# Patient Record
Sex: Male | Born: 1955 | Race: Black or African American | Hispanic: No | Marital: Married | Smoking: Never smoker
Health system: Southern US, Community
[De-identification: ages and names within clinical notes are randomized; demographics above are authoritative.]

## PROBLEM LIST (undated history)

## (undated) DIAGNOSIS — D332 Benign neoplasm of brain, unspecified: Secondary | ICD-10-CM

## (undated) DIAGNOSIS — I10 Essential (primary) hypertension: Secondary | ICD-10-CM

## (undated) DIAGNOSIS — G8929 Other chronic pain: Secondary | ICD-10-CM

## (undated) DIAGNOSIS — I7100 Dissection of unspecified site of aorta: Secondary | ICD-10-CM

## (undated) DIAGNOSIS — M549 Dorsalgia, unspecified: Secondary | ICD-10-CM

## (undated) HISTORY — PX: CARDIAC SURGERY: SHX584

## (undated) HISTORY — PX: BACK SURGERY: SHX140

## (undated) HISTORY — PX: BRAIN SURGERY: SHX531

---

## 2016-12-20 ENCOUNTER — Emergency Department: Payer: Medicare Other

## 2016-12-20 ENCOUNTER — Emergency Department
Admission: EM | Admit: 2016-12-20 | Discharge: 2016-12-20 | Disposition: A | Discharge status: Home / Self Care | Payer: Medicare Other | Attending: Student in an Organized Health Care Education/Training Program | Admitting: Student in an Organized Health Care Education/Training Program

## 2016-12-20 ENCOUNTER — Encounter: Payer: Self-pay | Admitting: Emergency Medicine

## 2016-12-20 DIAGNOSIS — E860 Dehydration: Secondary | ICD-10-CM | POA: Insufficient documentation

## 2016-12-20 DIAGNOSIS — R55 Syncope and collapse: Secondary | ICD-10-CM

## 2016-12-20 DIAGNOSIS — I10 Essential (primary) hypertension: Secondary | ICD-10-CM | POA: Insufficient documentation

## 2016-12-20 HISTORY — DX: Essential (primary) hypertension: I10

## 2016-12-20 HISTORY — DX: Rider (driver) (passenger) of other motorcycle injured in unspecified traffic accident, initial encounter: V29.99XA

## 2016-12-20 HISTORY — DX: Other chronic pain: G89.29

## 2016-12-20 HISTORY — DX: Benign neoplasm of brain, unspecified: D33.2

## 2016-12-20 HISTORY — DX: Dissection of unspecified site of aorta: I71.00

## 2016-12-20 HISTORY — DX: Dorsalgia, unspecified: M54.9

## 2016-12-20 LAB — URINALYSIS, COMPLETE (UACMP) WITH MICROSCOPIC
Bacteria, UA: NONE SEEN
Glucose, UA: NEGATIVE mg/dL
Ketones, ur: NEGATIVE mg/dL
Leukocytes, UA: NEGATIVE
Nitrite: NEGATIVE
Protein, ur: 30 mg/dL — AB
Specific Gravity, Urine: 1.029 (ref 1.005–1.030)
Squamous Epithelial / LPF: NONE SEEN
pH: 5 (ref 5.0–8.0)

## 2016-12-20 LAB — BASIC METABOLIC PANEL
Anion gap: 8 (ref 5–15)
BUN: 8 mg/dL (ref 6–20)
CO2: 28 mmol/L (ref 22–32)
Calcium: 9.4 mg/dL (ref 8.9–10.3)
Chloride: 100 mmol/L — ABNORMAL LOW (ref 101–111)
Creatinine, Ser: 0.76 mg/dL (ref 0.61–1.24)
GFR calc Af Amer: >60 mL/min (ref >60)
GFR calc non Af Amer: >60 mL/min (ref >60)
Glucose, Bld: 90 mg/dL (ref 65–99)
Potassium: 3 mmol/L — ABNORMAL LOW (ref 3.5–5.1)
Sodium: 136 mmol/L (ref 135–145)

## 2016-12-20 LAB — CBC
HCT: 42.2 % (ref 40.0–52.0)
Hemoglobin: 14.2 g/dL (ref 13.0–18.0)
MCH: 32.2 pg (ref 26.0–34.0)
MCHC: 33.7 g/dL (ref 32.0–36.0)
MCV: 95.7 fL (ref 80.0–100.0)
Platelets: 140 10*3/uL — ABNORMAL LOW (ref 150–440)
RBC: 4.41 MIL/uL (ref 4.40–5.90)
RDW: 14.5 % (ref 11.5–14.5)
WBC: 8.4 10*3/uL (ref 3.8–10.6)

## 2016-12-20 LAB — TROPONIN I: Troponin I: <0.03 ng/mL (ref <0.03)

## 2016-12-20 MED ORDER — HYDROCODONE-ACETAMINOPHEN 5-325 MG PO TABS
1.0000 | ORAL_TABLET | Freq: Once | ORAL | Status: AC
  Administered 2016-12-20: 1 via ORAL
  Filled 2016-12-20: qty 1

## 2016-12-20 MED ORDER — SODIUM CHLORIDE 0.9 % IV BOLUS (SEPSIS)
500.0000 mL | Freq: Once | INTRAVENOUS | Status: AC
  Administered 2016-12-20: 500 mL via INTRAVENOUS

## 2016-12-20 MED ORDER — NOREPINEPHRINE BITARTRATE 1 MG/ML IV SOLN
0.0000 ug/min | Freq: Once | INTRAVENOUS | Status: DC
  Filled 2016-12-20: qty 4

## 2016-12-20 NOTE — ED Notes (Signed)
Called the lab and advised them we could not obtain blood. They said they would send someone when they could.

## 2016-12-20 NOTE — ED Notes (Signed)
Pt ambulatory approximately 100 feet with steady gait, standby assist only.  Pt denies any dizziness at this time.  MD aware.
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
## 2016-12-20 NOTE — ED Triage Notes (Signed)
Pt in via EMS from home; pt reports syncopal episode today at this store approximately one hour ago.  Pt reports N/V/D x 4 days.  EMS reports initial BP 102/68; 50cc IVF given prior to arrival.  Pt A/Ox4, vitals WDL, no immediate distress at this time.

## 2020-09-30 ENCOUNTER — Emergency Department: Payer: Medicare Other

## 2020-09-30 ENCOUNTER — Inpatient Hospital Stay: Admit: 2020-09-30 | Payer: Medicare Other

## 2020-09-30 ENCOUNTER — Other Ambulatory Visit: Payer: Self-pay

## 2020-09-30 ENCOUNTER — Inpatient Hospital Stay
Admission: EM | Admit: 2020-09-30 | Discharge: 2020-10-08 | DRG: 481 | Disposition: A | Discharge status: Skilled Nursing Facility | Payer: Medicare Other | Attending: Internal Medicine | Admitting: Internal Medicine

## 2020-09-30 DIAGNOSIS — Z952 Presence of prosthetic heart valve: Secondary | ICD-10-CM

## 2020-09-30 DIAGNOSIS — E872 Acidosis: Secondary | ICD-10-CM | POA: Diagnosis present

## 2020-09-30 DIAGNOSIS — R112 Nausea with vomiting, unspecified: Secondary | ICD-10-CM | POA: Diagnosis not present

## 2020-09-30 DIAGNOSIS — Z79899 Other long term (current) drug therapy: Secondary | ICD-10-CM

## 2020-09-30 DIAGNOSIS — K567 Ileus, unspecified: Secondary | ICD-10-CM | POA: Diagnosis present

## 2020-09-30 DIAGNOSIS — I248 Other forms of acute ischemic heart disease: Secondary | ICD-10-CM | POA: Diagnosis present

## 2020-09-30 DIAGNOSIS — R197 Diarrhea, unspecified: Secondary | ICD-10-CM

## 2020-09-30 DIAGNOSIS — S72002S Fracture of unspecified part of neck of left femur, sequela: Secondary | ICD-10-CM | POA: Diagnosis not present

## 2020-09-30 DIAGNOSIS — W1809XA Striking against other object with subsequent fall, initial encounter: Secondary | ICD-10-CM | POA: Diagnosis present

## 2020-09-30 DIAGNOSIS — R0902 Hypoxemia: Secondary | ICD-10-CM | POA: Diagnosis present

## 2020-09-30 DIAGNOSIS — Z20822 Contact with and (suspected) exposure to covid-19: Secondary | ICD-10-CM | POA: Diagnosis present

## 2020-09-30 DIAGNOSIS — E876 Hypokalemia: Secondary | ICD-10-CM | POA: Diagnosis present

## 2020-09-30 DIAGNOSIS — I2699 Other pulmonary embolism without acute cor pulmonale: Secondary | ICD-10-CM

## 2020-09-30 DIAGNOSIS — E440 Moderate protein-calorie malnutrition: Secondary | ICD-10-CM | POA: Diagnosis present

## 2020-09-30 DIAGNOSIS — Z9114 Patient's other noncompliance with medication regimen: Secondary | ICD-10-CM | POA: Diagnosis not present

## 2020-09-30 DIAGNOSIS — G8929 Other chronic pain: Secondary | ICD-10-CM | POA: Diagnosis present

## 2020-09-30 DIAGNOSIS — K3189 Other diseases of stomach and duodenum: Secondary | ICD-10-CM | POA: Diagnosis present

## 2020-09-30 DIAGNOSIS — Z888 Allergy status to other drugs, medicaments and biological substances status: Secondary | ICD-10-CM

## 2020-09-30 DIAGNOSIS — G4700 Insomnia, unspecified: Secondary | ICD-10-CM | POA: Diagnosis present

## 2020-09-30 DIAGNOSIS — Y92009 Unspecified place in unspecified non-institutional (private) residence as the place of occurrence of the external cause: Secondary | ICD-10-CM

## 2020-09-30 DIAGNOSIS — T148XXA Other injury of unspecified body region, initial encounter: Secondary | ICD-10-CM

## 2020-09-30 DIAGNOSIS — W19XXXA Unspecified fall, initial encounter: Secondary | ICD-10-CM

## 2020-09-30 DIAGNOSIS — I82452 Acute embolism and thrombosis of left peroneal vein: Secondary | ICD-10-CM | POA: Diagnosis present

## 2020-09-30 DIAGNOSIS — Q631 Lobulated, fused and horseshoe kidney: Secondary | ICD-10-CM

## 2020-09-30 DIAGNOSIS — K310 Acute dilatation of stomach: Secondary | ICD-10-CM

## 2020-09-30 DIAGNOSIS — D500 Iron deficiency anemia secondary to blood loss (chronic): Secondary | ICD-10-CM | POA: Diagnosis present

## 2020-09-30 DIAGNOSIS — Z7901 Long term (current) use of anticoagulants: Secondary | ICD-10-CM

## 2020-09-30 DIAGNOSIS — S72142A Displaced intertrochanteric fracture of left femur, initial encounter for closed fracture: Secondary | ICD-10-CM | POA: Diagnosis present

## 2020-09-30 DIAGNOSIS — S72002A Fracture of unspecified part of neck of left femur, initial encounter for closed fracture: Secondary | ICD-10-CM

## 2020-09-30 DIAGNOSIS — Z86711 Personal history of pulmonary embolism: Secondary | ICD-10-CM

## 2020-09-30 DIAGNOSIS — E8809 Other disorders of plasma-protein metabolism, not elsewhere classified: Secondary | ICD-10-CM

## 2020-09-30 DIAGNOSIS — Z6820 Body mass index (BMI) 20.0-20.9, adult: Secondary | ICD-10-CM

## 2020-09-30 DIAGNOSIS — I10 Essential (primary) hypertension: Secondary | ICD-10-CM | POA: Diagnosis present

## 2020-09-30 DIAGNOSIS — M25552 Pain in left hip: Secondary | ICD-10-CM

## 2020-09-30 DIAGNOSIS — F1010 Alcohol abuse, uncomplicated: Secondary | ICD-10-CM | POA: Diagnosis present

## 2020-09-30 DIAGNOSIS — Z9181 History of falling: Secondary | ICD-10-CM | POA: Diagnosis not present

## 2020-09-30 DIAGNOSIS — Z01810 Encounter for preprocedural cardiovascular examination: Secondary | ICD-10-CM | POA: Diagnosis not present

## 2020-09-30 DIAGNOSIS — D649 Anemia, unspecified: Secondary | ICD-10-CM

## 2020-09-30 DIAGNOSIS — A419 Sepsis, unspecified organism: Secondary | ICD-10-CM

## 2020-09-30 DIAGNOSIS — R079 Chest pain, unspecified: Secondary | ICD-10-CM | POA: Diagnosis not present

## 2020-09-30 DIAGNOSIS — R296 Repeated falls: Secondary | ICD-10-CM | POA: Diagnosis present

## 2020-09-30 DIAGNOSIS — S72009A Fracture of unspecified part of neck of unspecified femur, initial encounter for closed fracture: Secondary | ICD-10-CM | POA: Diagnosis present

## 2020-09-30 LAB — TROPONIN I (HIGH SENSITIVITY)
Troponin I (High Sensitivity): 33 ng/L — ABNORMAL HIGH (ref <18)
Troponin I (High Sensitivity): 38 ng/L — ABNORMAL HIGH (ref <18)
Troponin I (High Sensitivity): 42 ng/L — ABNORMAL HIGH (ref <18)
Troponin I (High Sensitivity): 48 ng/L — ABNORMAL HIGH (ref <18)

## 2020-09-30 LAB — IRON AND TIBC
Iron: 36 ug/dL — ABNORMAL LOW (ref 45–182)
Saturation Ratios: 10 % — ABNORMAL LOW (ref 17.9–39.5)
TIBC: 347 ug/dL (ref 250–450)
UIBC: 311 ug/dL

## 2020-09-30 LAB — COMPREHENSIVE METABOLIC PANEL
ALT: 14 U/L (ref 0–44)
ALT: 12 U/L (ref 0–44)
AST: 38 U/L (ref 15–41)
AST: 38 U/L (ref 15–41)
Albumin: 3.8 g/dL (ref 3.5–5.0)
Albumin: 2.8 g/dL — ABNORMAL LOW (ref 3.5–5.0)
Alkaline Phosphatase: 76 U/L (ref 38–126)
Alkaline Phosphatase: 56 U/L (ref 38–126)
Anion gap: 18 — ABNORMAL HIGH (ref 5–15)
Anion gap: 13 (ref 5–15)
BUN: 8 mg/dL (ref 8–23)
BUN: 7 mg/dL — ABNORMAL LOW (ref 8–23)
CO2: 24 mmol/L (ref 22–32)
CO2: 27 mmol/L (ref 22–32)
Calcium: 8.9 mg/dL (ref 8.9–10.3)
Calcium: 7.9 mg/dL — ABNORMAL LOW (ref 8.9–10.3)
Chloride: 91 mmol/L — ABNORMAL LOW (ref 98–111)
Chloride: 91 mmol/L — ABNORMAL LOW (ref 98–111)
Creatinine, Ser: 0.74 mg/dL (ref 0.61–1.24)
Creatinine, Ser: 0.79 mg/dL (ref 0.61–1.24)
GFR, Estimated: >60 mL/min (ref >60)
GFR, Estimated: >60 mL/min (ref >60)
Glucose, Bld: 185 mg/dL — ABNORMAL HIGH (ref 70–99)
Glucose, Bld: 164 mg/dL — ABNORMAL HIGH (ref 70–99)
Potassium: 2.6 mmol/L — CL (ref 3.5–5.1)
Potassium: 3.2 mmol/L — ABNORMAL LOW (ref 3.5–5.1)
Sodium: 133 mmol/L — ABNORMAL LOW (ref 135–145)
Sodium: 131 mmol/L — ABNORMAL LOW (ref 135–145)
Total Bilirubin: 1.2 mg/dL (ref 0.3–1.2)
Total Bilirubin: 1.2 mg/dL (ref 0.3–1.2)
Total Protein: 7.2 g/dL (ref 6.5–8.1)
Total Protein: 5.2 g/dL — ABNORMAL LOW (ref 6.5–8.1)

## 2020-09-30 LAB — CBC
HCT: 25.6 % — ABNORMAL LOW (ref 39.0–52.0)
Hemoglobin: 8.3 g/dL — ABNORMAL LOW (ref 13.0–17.0)
MCH: 24.5 pg — ABNORMAL LOW (ref 26.0–34.0)
MCHC: 32.4 g/dL (ref 30.0–36.0)
MCV: 75.5 fL — ABNORMAL LOW (ref 80.0–100.0)
Platelets: 129 10*3/uL — ABNORMAL LOW (ref 150–400)
RBC: 3.39 MIL/uL — ABNORMAL LOW (ref 4.22–5.81)
RDW: 18.3 % — ABNORMAL HIGH (ref 11.5–15.5)
WBC: 14 10*3/uL — ABNORMAL HIGH (ref 4.0–10.5)
nRBC: 0 % (ref 0.0–0.2)

## 2020-09-30 LAB — CBC WITH DIFFERENTIAL/PLATELET
Abs Immature Granulocytes: 0.1 10*3/uL — ABNORMAL HIGH (ref 0.00–0.07)
Basophils Absolute: 0.1 10*3/uL (ref 0.0–0.1)
Basophils Relative: 0 %
Eosinophils Absolute: 0 10*3/uL (ref 0.0–0.5)
Eosinophils Relative: 0 %
HCT: 34.2 % — ABNORMAL LOW (ref 39.0–52.0)
Hemoglobin: 11.1 g/dL — ABNORMAL LOW (ref 13.0–17.0)
Immature Granulocytes: 1 %
Lymphocytes Relative: 7 %
Lymphs Abs: 1 10*3/uL (ref 0.7–4.0)
MCH: 24.3 pg — ABNORMAL LOW (ref 26.0–34.0)
MCHC: 32.5 g/dL (ref 30.0–36.0)
MCV: 75 fL — ABNORMAL LOW (ref 80.0–100.0)
Monocytes Absolute: 1.3 10*3/uL — ABNORMAL HIGH (ref 0.1–1.0)
Monocytes Relative: 8 %
Neutro Abs: 13.1 10*3/uL — ABNORMAL HIGH (ref 1.7–7.7)
Neutrophils Relative %: 84 %
Platelets: 178 10*3/uL (ref 150–400)
RBC: 4.56 MIL/uL (ref 4.22–5.81)
RDW: 18.5 % — ABNORMAL HIGH (ref 11.5–15.5)
WBC: 15.6 10*3/uL — ABNORMAL HIGH (ref 4.0–10.5)
nRBC: 0 % (ref 0.0–0.2)

## 2020-09-30 LAB — URINALYSIS, COMPLETE (UACMP) WITH MICROSCOPIC
Bacteria, UA: NONE SEEN
Bilirubin Urine: NEGATIVE
Glucose, UA: NEGATIVE mg/dL
Ketones, ur: NEGATIVE mg/dL
Leukocytes,Ua: NEGATIVE
Nitrite: NEGATIVE
Protein, ur: NEGATIVE mg/dL
Specific Gravity, Urine: 1.039 — ABNORMAL HIGH (ref 1.005–1.030)
Squamous Epithelial / HPF: NONE SEEN (ref 0–5)
pH: 5 (ref 5.0–8.0)

## 2020-09-30 LAB — PROCALCITONIN: Procalcitonin: <0.1 ng/mL

## 2020-09-30 LAB — LACTIC ACID, PLASMA
Lactic Acid, Venous: 6.2 mmol/L (ref 0.5–1.9)
Lactic Acid, Venous: 3.2 mmol/L (ref 0.5–1.9)
Lactic Acid, Venous: 4 mmol/L (ref 0.5–1.9)
Lactic Acid, Venous: 4.1 mmol/L (ref 0.5–1.9)

## 2020-09-30 LAB — MAGNESIUM
Magnesium: 1.1 mg/dL — ABNORMAL LOW (ref 1.7–2.4)
Magnesium: 1.5 mg/dL — ABNORMAL LOW (ref 1.7–2.4)

## 2020-09-30 LAB — BRAIN NATRIURETIC PEPTIDE: B Natriuretic Peptide: 123.5 pg/mL — ABNORMAL HIGH (ref 0.0–100.0)

## 2020-09-30 LAB — HEMOGLOBIN: Hemoglobin: 8.7 g/dL — ABNORMAL LOW (ref 13.0–17.0)

## 2020-09-30 LAB — HIV ANTIBODY (ROUTINE TESTING W REFLEX): HIV Screen 4th Generation wRfx: NONREACTIVE

## 2020-09-30 LAB — PROTIME-INR
INR: 1 (ref 0.8–1.2)
Prothrombin Time: 12.5 seconds (ref 11.4–15.2)

## 2020-09-30 LAB — POC SARS CORONAVIRUS 2 AG -  ED: SARS Coronavirus 2 Ag: NEGATIVE

## 2020-09-30 LAB — PHOSPHORUS: Phosphorus: 3.7 mg/dL (ref 2.5–4.6)

## 2020-09-30 MED ORDER — MAGNESIUM SULFATE 2 GM/50ML IV SOLN
2.0000 g | Freq: Once | INTRAVENOUS | Status: AC
  Administered 2020-09-30: 2 g via INTRAVENOUS
  Filled 2020-09-30: qty 50

## 2020-09-30 MED ORDER — OXYCODONE HCL 5 MG PO TABS: 5.0000 mg | ORAL_TABLET | Freq: Four times a day (QID) | ORAL | Status: DC | PRN

## 2020-09-30 MED ORDER — THIAMINE HCL 100 MG PO TABS
100.0000 mg | ORAL_TABLET | Freq: Every day | ORAL | Status: DC
  Administered 2020-09-30 – 2020-10-08 (×6): 100 mg via ORAL
  Filled 2020-09-30 (×7): qty 1

## 2020-09-30 MED ORDER — HEPARIN SODIUM (PORCINE) 5000 UNIT/ML IJ SOLN
5000.0000 [IU] | Freq: Three times a day (TID) | INTRAMUSCULAR | Status: DC
  Administered 2020-09-30: 5000 [IU] via SUBCUTANEOUS
  Filled 2020-09-30: qty 1

## 2020-09-30 MED ORDER — ADULT MULTIVITAMIN W/MINERALS CH
1.0000 | ORAL_TABLET | Freq: Every day | ORAL | Status: DC
  Administered 2020-09-30 – 2020-10-02 (×3): 1 via ORAL
  Filled 2020-09-30 (×4): qty 1

## 2020-09-30 MED ORDER — POTASSIUM CHLORIDE 10 MEQ/100ML IV SOLN
10.0000 meq | INTRAVENOUS | Status: AC
  Administered 2020-09-30 (×3): 10 meq via INTRAVENOUS
  Filled 2020-09-30 (×3): qty 100

## 2020-09-30 MED ORDER — DILTIAZEM HCL 25 MG/5ML IV SOLN: 20.0000 mg | Freq: Once | INTRAVENOUS | Status: AC

## 2020-09-30 MED ORDER — VANCOMYCIN HCL IN DEXTROSE 1-5 GM/200ML-% IV SOLN
1000.0000 mg | Freq: Once | INTRAVENOUS | Status: AC
  Administered 2020-09-30: 1000 mg via INTRAVENOUS
  Filled 2020-09-30: qty 200

## 2020-09-30 MED ORDER — THIAMINE HCL 100 MG/ML IJ SOLN
100.0000 mg | Freq: Every day | INTRAMUSCULAR | Status: DC
  Administered 2020-10-03 – 2020-10-07 (×4): 100 mg via INTRAVENOUS
  Filled 2020-09-30 (×7): qty 2

## 2020-09-30 MED ORDER — LORAZEPAM 1 MG PO TABS
1.0000 mg | ORAL_TABLET | ORAL | Status: AC | PRN
  Administered 2020-10-01: 1 mg via ORAL
  Administered 2020-10-02: 2 mg via ORAL
  Filled 2020-09-30: qty 1
  Filled 2020-09-30: qty 2

## 2020-09-30 MED ORDER — LACTATED RINGERS IV SOLN: INTRAVENOUS | Status: AC

## 2020-09-30 MED ORDER — MORPHINE SULFATE (PF) 2 MG/ML IV SOLN
0.5000 mg | INTRAVENOUS | Status: DC | PRN
  Administered 2020-09-30 – 2020-10-02 (×2): 0.5 mg via INTRAVENOUS
  Filled 2020-09-30 (×2): qty 1

## 2020-09-30 MED ORDER — MORPHINE SULFATE (PF) 4 MG/ML IV SOLN
4.0000 mg | Freq: Once | INTRAVENOUS | Status: AC
  Administered 2020-09-30: 4 mg via INTRAVENOUS
  Filled 2020-09-30: qty 1

## 2020-09-30 MED ORDER — HYDROCODONE-ACETAMINOPHEN 5-325 MG PO TABS
1.0000 | ORAL_TABLET | Freq: Four times a day (QID) | ORAL | Status: DC | PRN
  Administered 2020-09-30: 2 via ORAL
  Filled 2020-09-30 (×2): qty 2

## 2020-09-30 MED ORDER — HYDROMORPHONE HCL 1 MG/ML IJ SOLN
0.5000 mg | INTRAMUSCULAR | Status: DC | PRN
  Administered 2020-09-30: 0.5 mg via INTRAVENOUS
  Filled 2020-09-30: qty 1

## 2020-09-30 MED ORDER — SENNOSIDES-DOCUSATE SODIUM 8.6-50 MG PO TABS: 1.0000 | ORAL_TABLET | Freq: Every evening | ORAL | Status: DC | PRN

## 2020-09-30 MED ORDER — LORAZEPAM 2 MG/ML IJ SOLN: 1.0000 mg | INTRAMUSCULAR | Status: AC | PRN

## 2020-09-30 MED ORDER — METOPROLOL TARTRATE 50 MG PO TABS
50.0000 mg | ORAL_TABLET | Freq: Once | ORAL | Status: AC
  Administered 2020-09-30: 50 mg via ORAL
  Filled 2020-09-30: qty 1

## 2020-09-30 MED ORDER — ONDANSETRON HCL 4 MG/2ML IJ SOLN: 4.0000 mg | Freq: Four times a day (QID) | INTRAMUSCULAR | Status: DC | PRN

## 2020-09-30 MED ORDER — SODIUM CHLORIDE 0.9 % IV SOLN: INTRAVENOUS | Status: DC

## 2020-09-30 MED ORDER — DILTIAZEM HCL 25 MG/5ML IV SOLN
INTRAVENOUS | Status: AC
  Administered 2020-09-30: 20 mg via INTRAVENOUS
  Filled 2020-09-30: qty 5

## 2020-09-30 MED ORDER — SODIUM CHLORIDE 0.9 % IV SOLN
2.0000 g | Freq: Once | INTRAVENOUS | Status: AC
  Administered 2020-09-30: 2 g via INTRAVENOUS
  Filled 2020-09-30: qty 2

## 2020-09-30 MED ORDER — CEFAZOLIN SODIUM-DEXTROSE 2-4 GM/100ML-% IV SOLN
2.0000 g | Freq: Once | INTRAVENOUS | Status: AC
  Administered 2020-09-30: 2 g via INTRAVENOUS
  Filled 2020-09-30: qty 100

## 2020-09-30 MED ORDER — MORPHINE SULFATE (PF) 4 MG/ML IV SOLN
6.0000 mg | Freq: Once | INTRAVENOUS | Status: AC
  Administered 2020-09-30: 6 mg via INTRAVENOUS
  Filled 2020-09-30: qty 2

## 2020-09-30 MED ORDER — SODIUM CHLORIDE 0.9 % IV BOLUS
1000.0000 mL | Freq: Once | INTRAVENOUS | Status: AC
  Administered 2020-09-30: 1000 mL via INTRAVENOUS

## 2020-09-30 MED ORDER — FOLIC ACID 1 MG PO TABS
1.0000 mg | ORAL_TABLET | Freq: Every day | ORAL | Status: DC
  Administered 2020-09-30 – 2020-10-08 (×9): 1 mg via ORAL
  Filled 2020-09-30 (×10): qty 1

## 2020-09-30 MED ORDER — CARVEDILOL 6.25 MG PO TABS
12.5000 mg | ORAL_TABLET | Freq: Once | ORAL | Status: AC
  Administered 2020-09-30: 12.5 mg via ORAL
  Filled 2020-09-30: qty 2

## 2020-09-30 MED ORDER — SODIUM CHLORIDE 0.9 % IV BOLUS (SEPSIS)
1000.0000 mL | Freq: Once | INTRAVENOUS | Status: AC
  Administered 2020-09-30: 1000 mL via INTRAVENOUS

## 2020-09-30 MED ORDER — IOHEXOL 350 MG/ML SOLN
100.0000 mL | Freq: Once | INTRAVENOUS | Status: AC | PRN
  Administered 2020-09-30: 100 mL via INTRAVENOUS
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
## 2020-09-30 NOTE — Progress Notes (Signed)
Full consult note and discussion with patient to follow tomorrow AM.  Called by ED staff. Imaging reviewed.  - Plan for surgery tomorrow, pending medical clearance/optimization. - NPO after midnight - Hold anticoagulation - Admit to Hospitalist team.

## 2020-09-30 NOTE — Plan of Care (Signed)

## 2020-09-30 NOTE — Progress Notes (Signed)
CODE SEPSIS - PHARMACY COMMUNICATION  **Broad Spectrum Antibiotics should be administered within 1 hour of Sepsis diagnosis**  Time Code Sepsis Called/Page Received: 13:47  Antibiotics Ordered: Cefepime and Vancomycin  Time of 1st antibiotic administration: Cefepime given at 14:04  Additional action taken by pharmacy: n/a  If necessary, Name of Provider/Nurse Contacted: n/a  Clovia Cuff, PharmD, BCPS 09/30/2020 2:11 PM

## 2020-09-30 NOTE — Sepsis Progress Note (Signed)
Notified provider of need to order repeat lactic acid. ° °
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
## 2020-09-30 NOTE — Progress Notes (Signed)
PHARMACY -  BRIEF ANTIBIOTIC NOTE   Pharmacy has received consult(s) for Vancomycin and Cefepime from an ED provider.  The patient's profile has been reviewed for ht/wt/allergies/indication/available labs.    One time order(s) placed for Cefepime 2g and Vancomycin 1g by ED provider  Further antibiotics/pharmacy consults should be ordered by admitting physician if indicated.                       Thank you, Foye Deer 09/30/2020  2:11 PM

## 2020-10-01 ENCOUNTER — Inpatient Hospital Stay: Payer: Medicare Other | Admitting: Registered Nurse

## 2020-10-01 ENCOUNTER — Inpatient Hospital Stay: Payer: Medicare Other

## 2020-10-01 ENCOUNTER — Encounter
Admission: EM | Disposition: A | Discharge status: Skilled Nursing Facility | Payer: Self-pay | Source: Home / Self Care | Attending: Internal Medicine

## 2020-10-01 ENCOUNTER — Inpatient Hospital Stay (HOSPITAL_COMMUNITY)
Admit: 2020-10-01 | Discharge: 2020-10-01 | Disposition: A | Discharge status: Home / Self Care | Payer: Medicare Other | Attending: Orthopedic Surgery | Admitting: Orthopedic Surgery

## 2020-10-01 ENCOUNTER — Encounter: Payer: Self-pay | Admitting: Internal Medicine

## 2020-10-01 DIAGNOSIS — Z952 Presence of prosthetic heart valve: Secondary | ICD-10-CM | POA: Diagnosis not present

## 2020-10-01 DIAGNOSIS — R079 Chest pain, unspecified: Secondary | ICD-10-CM

## 2020-10-01 DIAGNOSIS — E8809 Other disorders of plasma-protein metabolism, not elsewhere classified: Secondary | ICD-10-CM

## 2020-10-01 DIAGNOSIS — Z01810 Encounter for preprocedural cardiovascular examination: Secondary | ICD-10-CM

## 2020-10-01 DIAGNOSIS — S72002A Fracture of unspecified part of neck of left femur, initial encounter for closed fracture: Secondary | ICD-10-CM | POA: Diagnosis not present

## 2020-10-01 HISTORY — PX: INTRAMEDULLARY (IM) NAIL INTERTROCHANTERIC: SHX5875

## 2020-10-01 LAB — CBC
HCT: 26.3 % — ABNORMAL LOW (ref 39.0–52.0)
Hemoglobin: 8.7 g/dL — ABNORMAL LOW (ref 13.0–17.0)
MCH: 24.6 pg — ABNORMAL LOW (ref 26.0–34.0)
MCHC: 33.1 g/dL (ref 30.0–36.0)
MCV: 74.5 fL — ABNORMAL LOW (ref 80.0–100.0)
Platelets: 119 10*3/uL — ABNORMAL LOW (ref 150–400)
RBC: 3.53 MIL/uL — ABNORMAL LOW (ref 4.22–5.81)
RDW: 18.2 % — ABNORMAL HIGH (ref 11.5–15.5)
WBC: 10.6 10*3/uL — ABNORMAL HIGH (ref 4.0–10.5)
nRBC: 0 % (ref 0.0–0.2)

## 2020-10-01 LAB — BASIC METABOLIC PANEL
Anion gap: 7 (ref 5–15)
Anion gap: 11 (ref 5–15)
BUN: 9 mg/dL (ref 8–23)
BUN: 10 mg/dL (ref 8–23)
CO2: 31 mmol/L (ref 22–32)
CO2: 25 mmol/L (ref 22–32)
Calcium: 8.5 mg/dL — ABNORMAL LOW (ref 8.9–10.3)
Calcium: 8.9 mg/dL (ref 8.9–10.3)
Chloride: 96 mmol/L — ABNORMAL LOW (ref 98–111)
Chloride: 96 mmol/L — ABNORMAL LOW (ref 98–111)
Creatinine, Ser: 0.69 mg/dL (ref 0.61–1.24)
Creatinine, Ser: 0.75 mg/dL (ref 0.61–1.24)
GFR, Estimated: >60 mL/min (ref >60)
GFR, Estimated: >60 mL/min (ref >60)
Glucose, Bld: 124 mg/dL — ABNORMAL HIGH (ref 70–99)
Glucose, Bld: 106 mg/dL — ABNORMAL HIGH (ref 70–99)
Potassium: 2.9 mmol/L — ABNORMAL LOW (ref 3.5–5.1)
Potassium: 4.9 mmol/L (ref 3.5–5.1)
Sodium: 134 mmol/L — ABNORMAL LOW (ref 135–145)
Sodium: 132 mmol/L — ABNORMAL LOW (ref 135–145)

## 2020-10-01 LAB — ECHOCARDIOGRAM COMPLETE
AR max vel: 1.1 cm2
AV Area VTI: 1.85 cm2
AV Area mean vel: 1.13 cm2
AV Mean grad: 10 mmHg
AV Peak grad: 17.9 mmHg
Ao pk vel: 2.11 m/s
Area-P 1/2: 4.96 cm2
Height: 71 in
S' Lateral: 2.45 cm
Weight: 2356.28 oz

## 2020-10-01 LAB — HEMOGLOBIN
Hemoglobin: 8 g/dL — ABNORMAL LOW (ref 13.0–17.0)
Hemoglobin: 8.3 g/dL — ABNORMAL LOW (ref 13.0–17.0)

## 2020-10-01 LAB — PROCALCITONIN: Procalcitonin: 3.01 ng/mL

## 2020-10-01 LAB — SARS CORONAVIRUS 2 (TAT 6-24 HRS): SARS Coronavirus 2: NEGATIVE

## 2020-10-01 LAB — TROPONIN I (HIGH SENSITIVITY)
Troponin I (High Sensitivity): 32 ng/L — ABNORMAL HIGH (ref <18)
Troponin I (High Sensitivity): 31 ng/L — ABNORMAL HIGH (ref <18)

## 2020-10-01 SURGERY — FIXATION, FRACTURE, INTERTROCHANTERIC, WITH INTRAMEDULLARY ROD
Anesthesia: General | Site: Hip | Laterality: Left

## 2020-10-01 MED ORDER — VANCOMYCIN HCL 750 MG/150ML IV SOLN
750.0000 mg | Freq: Three times a day (TID) | INTRAVENOUS | Status: DC
  Administered 2020-10-01 – 2020-10-03 (×5): 750 mg via INTRAVENOUS
  Filled 2020-10-01 (×8): qty 150

## 2020-10-01 MED ORDER — SODIUM CHLORIDE 0.9 % IV SOLN
2.0000 g | Freq: Three times a day (TID) | INTRAVENOUS | Status: DC
  Administered 2020-10-01 – 2020-10-03 (×6): 2 g via INTRAVENOUS
  Filled 2020-10-01 (×10): qty 2

## 2020-10-01 MED ORDER — HYDROCODONE-ACETAMINOPHEN 5-325 MG PO TABS
1.0000 | ORAL_TABLET | ORAL | Status: DC | PRN
  Administered 2020-10-01 (×2): 2 via ORAL
  Administered 2020-10-01 – 2020-10-02 (×2): 1 via ORAL
  Administered 2020-10-02: 2 via ORAL
  Filled 2020-10-01: qty 2
  Filled 2020-10-01: qty 1
  Filled 2020-10-01 (×3): qty 2

## 2020-10-01 MED ORDER — FENTANYL CITRATE (PF) 100 MCG/2ML IJ SOLN
INTRAMUSCULAR | Status: DC | PRN
  Administered 2020-10-01: 100 ug via INTRAVENOUS
  Administered 2020-10-01: 50 ug via INTRAVENOUS

## 2020-10-01 MED ORDER — POTASSIUM CHLORIDE 10 MEQ/100ML IV SOLN
10.0000 meq | INTRAVENOUS | Status: AC
  Administered 2020-10-01 (×5): 10 meq via INTRAVENOUS
  Filled 2020-10-01 (×6): qty 100

## 2020-10-01 MED ORDER — ONDANSETRON HCL 4 MG/2ML IJ SOLN
INTRAMUSCULAR | Status: DC | PRN
  Administered 2020-10-01: 4 mg via INTRAVENOUS

## 2020-10-01 MED ORDER — BUPIVACAINE HCL (PF) 0.5 % IJ SOLN
INTRAMUSCULAR | Status: DC | PRN
  Administered 2020-10-01: 30 mL

## 2020-10-01 MED ORDER — FENTANYL CITRATE (PF) 250 MCG/5ML IJ SOLN
INTRAMUSCULAR | Status: AC
  Filled 2020-10-01: qty 5

## 2020-10-01 MED ORDER — LACTATED RINGERS IV SOLN: INTRAVENOUS | Status: DC | PRN

## 2020-10-01 MED ORDER — DEXAMETHASONE SODIUM PHOSPHATE 10 MG/ML IJ SOLN
INTRAMUSCULAR | Status: DC | PRN
  Administered 2020-10-01: 10 mg via INTRAVENOUS

## 2020-10-01 MED ORDER — EPHEDRINE SULFATE 50 MG/ML IJ SOLN
INTRAMUSCULAR | Status: DC | PRN
  Administered 2020-10-01: 15 mg via INTRAVENOUS

## 2020-10-01 MED ORDER — ONDANSETRON HCL 4 MG/2ML IJ SOLN
INTRAMUSCULAR | Status: AC
  Filled 2020-10-01: qty 2

## 2020-10-01 MED ORDER — ROCURONIUM BROMIDE 10 MG/ML (PF) SYRINGE
PREFILLED_SYRINGE | INTRAVENOUS | Status: AC
  Filled 2020-10-01: qty 10

## 2020-10-01 MED ORDER — ENSURE ENLIVE PO LIQD
237.0000 mL | Freq: Three times a day (TID) | ORAL | Status: DC
  Filled 2020-10-01: qty 237

## 2020-10-01 MED ORDER — POTASSIUM CHLORIDE CRYS ER 20 MEQ PO TBCR
40.0000 meq | EXTENDED_RELEASE_TABLET | Freq: Once | ORAL | Status: AC
  Administered 2020-10-01: 40 meq via ORAL
  Filled 2020-10-01: qty 2

## 2020-10-01 MED ORDER — PROPOFOL 10 MG/ML IV BOLUS
INTRAVENOUS | Status: AC
  Filled 2020-10-01: qty 20

## 2020-10-01 MED ORDER — ACETAMINOPHEN 10 MG/ML IV SOLN
INTRAVENOUS | Status: DC | PRN
  Administered 2020-10-01: 1000 mg via INTRAVENOUS

## 2020-10-01 MED ORDER — ONDANSETRON HCL 4 MG/2ML IJ SOLN: 4.0000 mg | Freq: Once | INTRAMUSCULAR | Status: DC | PRN

## 2020-10-01 MED ORDER — CEFAZOLIN SODIUM-DEXTROSE 2-3 GM-%(50ML) IV SOLR
INTRAVENOUS | Status: DC | PRN
  Administered 2020-10-01: 2 g via INTRAVENOUS

## 2020-10-01 MED ORDER — SUCCINYLCHOLINE CHLORIDE 200 MG/10ML IV SOSY
PREFILLED_SYRINGE | INTRAVENOUS | Status: AC
  Filled 2020-10-01: qty 10

## 2020-10-01 MED ORDER — LIDOCAINE HCL (PF) 2 % IJ SOLN
INTRAMUSCULAR | Status: AC
  Filled 2020-10-01: qty 5

## 2020-10-01 MED ORDER — CEFAZOLIN SODIUM 1 G IJ SOLR
INTRAMUSCULAR | Status: AC
  Filled 2020-10-01: qty 20

## 2020-10-01 MED ORDER — OXYCODONE HCL 5 MG PO TABS: 5.0000 mg | ORAL_TABLET | Freq: Once | ORAL | Status: DC | PRN

## 2020-10-01 MED ORDER — BUPIVACAINE LIPOSOME 1.3 % IJ SUSP
INTRAMUSCULAR | Status: DC | PRN
  Administered 2020-10-01: 20 mL

## 2020-10-01 MED ORDER — PROPOFOL 10 MG/ML IV BOLUS
INTRAVENOUS | Status: DC | PRN
  Administered 2020-10-01: 120 mg via INTRAVENOUS

## 2020-10-01 MED ORDER — MIDAZOLAM HCL 2 MG/2ML IJ SOLN
INTRAMUSCULAR | Status: AC
  Filled 2020-10-01: qty 2

## 2020-10-01 MED ORDER — PHENYLEPHRINE HCL (PRESSORS) 10 MG/ML IV SOLN
INTRAVENOUS | Status: DC | PRN
  Administered 2020-10-01 (×2): 100 ug via INTRAVENOUS
  Administered 2020-10-01: 200 ug via INTRAVENOUS
  Administered 2020-10-01: 100 ug via INTRAVENOUS

## 2020-10-01 MED ORDER — FENTANYL CITRATE (PF) 100 MCG/2ML IJ SOLN: 25.0000 ug | INTRAMUSCULAR | Status: DC | PRN

## 2020-10-01 MED ORDER — DEXAMETHASONE SODIUM PHOSPHATE 10 MG/ML IJ SOLN
INTRAMUSCULAR | Status: AC
  Filled 2020-10-01: qty 1

## 2020-10-01 MED ORDER — VANCOMYCIN HCL IN DEXTROSE 1-5 GM/200ML-% IV SOLN
1000.0000 mg | INTRAVENOUS | Status: DC
  Filled 2020-10-01: qty 200

## 2020-10-01 MED ORDER — VASOPRESSIN 20 UNIT/ML IV SOLN
INTRAVENOUS | Status: DC | PRN
  Administered 2020-10-01: 1 [IU] via INTRAVENOUS

## 2020-10-01 MED ORDER — ROCURONIUM BROMIDE 100 MG/10ML IV SOLN
INTRAVENOUS | Status: DC | PRN
  Administered 2020-10-01: 30 mg via INTRAVENOUS

## 2020-10-01 MED ORDER — LIDOCAINE HCL (CARDIAC) PF 100 MG/5ML IV SOSY
PREFILLED_SYRINGE | INTRAVENOUS | Status: DC | PRN
  Administered 2020-10-01: 100 mg via INTRAVENOUS

## 2020-10-01 MED ORDER — NEOMYCIN-POLYMYXIN B GU 40-200000 IR SOLN
Status: DC | PRN
  Administered 2020-10-01: 4 mL

## 2020-10-01 MED ORDER — VANCOMYCIN HCL IN DEXTROSE 1-5 GM/200ML-% IV SOLN
1000.0000 mg | Freq: Two times a day (BID) | INTRAVENOUS | Status: DC
  Administered 2020-10-01: 1000 mg via INTRAVENOUS
  Filled 2020-10-01 (×2): qty 200

## 2020-10-01 MED ORDER — MIDAZOLAM HCL 2 MG/2ML IJ SOLN
INTRAMUSCULAR | Status: DC | PRN
  Administered 2020-10-01: 2 mg via INTRAVENOUS

## 2020-10-01 MED ORDER — SODIUM CHLORIDE 0.9 % IV SOLN
INTRAVENOUS | Status: DC | PRN
  Administered 2020-10-01: 50 ug/min via INTRAVENOUS

## 2020-10-01 MED ORDER — VASOPRESSIN 20 UNIT/ML IV SOLN
INTRAVENOUS | Status: DC | PRN
  Administered 2020-10-01: 2 [IU] via INTRAVENOUS

## 2020-10-01 MED ORDER — ENSURE ENLIVE PO LIQD
237.0000 mL | Freq: Three times a day (TID) | ORAL | Status: DC
  Administered 2020-10-02 – 2020-10-07 (×5): 237 mL via ORAL
  Filled 2020-10-01: qty 237

## 2020-10-01 MED ORDER — ACETAMINOPHEN 10 MG/ML IV SOLN
INTRAVENOUS | Status: AC
  Filled 2020-10-01: qty 100

## 2020-10-01 MED ORDER — OXYCODONE HCL 5 MG/5ML PO SOLN: 5.0000 mg | Freq: Once | ORAL | Status: DC | PRN

## 2020-10-01 SURGICAL SUPPLY — 52 items
"PENCIL ELECTRO HAND CTR " (MISCELLANEOUS) ×1 IMPLANT
BIT DRILL INTERTAN LAG SCREW (BIT) ×1 IMPLANT
BIT DRILL SHORT 4.0 (BIT) IMPLANT
BLADE SURG 10 STRL SS (BLADE) ×1 IMPLANT
BLADE SURG 15 STRL LF DISP TIS (BLADE) ×1 IMPLANT
BLADE SURG 15 STRL SS (BLADE) ×1
CANISTER SUCT 1200ML W/VALVE (MISCELLANEOUS) ×1 IMPLANT
CHLORAPREP W/TINT 26 (MISCELLANEOUS) ×2 IMPLANT
COVER WAND RF STERILE (DRAPES) ×2 IMPLANT
DRAPE 3/4 80X56 (DRAPES) ×2 IMPLANT
DRAPE SURG 17X11 SM STRL (DRAPES) ×4 IMPLANT
DRAPE U-SHAPE 47X51 STRL (DRAPES) ×4 IMPLANT
DRILL BIT SHORT 4.0 (BIT) ×1
DRSG OPSITE POSTOP 3X4 (GAUZE/BANDAGES/DRESSINGS) ×9 IMPLANT
ELECT REM PT RETURN 9FT ADLT (ELECTROSURGICAL) ×2
ELECTRODE REM PT RTRN 9FT ADLT (ELECTROSURGICAL) ×1 IMPLANT
GLOVE BIOGEL PI IND STRL 8 (GLOVE) ×1 IMPLANT
GLOVE BIOGEL PI INDICATOR 8 (GLOVE) ×2
GLOVE SURG SYN 7.5  E (GLOVE) ×1
GLOVE SURG SYN 7.5 E (GLOVE) ×1 IMPLANT
GLOVE SURG SYN 7.5 PF PI (GLOVE) ×1 IMPLANT
GLOVE SURG SYN 8.0 (GLOVE) ×4 IMPLANT
GLOVE SURG SYN 8.0 PF PI (GLOVE) IMPLANT
GOWN STRL REUS W/ TWL LRG LVL3 (GOWN DISPOSABLE) ×1 IMPLANT
GOWN STRL REUS W/ TWL XL LVL3 (GOWN DISPOSABLE) ×1 IMPLANT
GOWN STRL REUS W/TWL LRG LVL3 (GOWN DISPOSABLE) ×1
GOWN STRL REUS W/TWL XL LVL3 (GOWN DISPOSABLE) ×1
GUIDE PIN 3.2X343 (PIN) ×3
GUIDE PIN 3.2X343MM (PIN) ×3
GUIDE ROD 3.0 (MISCELLANEOUS) ×2
KIT PATIENT CARE HANA TABLE (KITS) ×2 IMPLANT
KIT TURNOVER KIT A (KITS) ×2 IMPLANT
MANIFOLD NEPTUNE II (INSTRUMENTS) ×2 IMPLANT
MAT ABSORB  FLUID 56X50 GRAY (MISCELLANEOUS) ×1
MAT ABSORB FLUID 56X50 GRAY (MISCELLANEOUS) ×2 IMPLANT
NAIL TRIGEN 10X42CM 125D LT (Nail) ×1 IMPLANT
NDL FILTER BLUNT 18X1 1/2 (NEEDLE) ×1 IMPLANT
NEEDLE FILTER BLUNT 18X 1/2SAF (NEEDLE) ×1
NEEDLE FILTER BLUNT 18X1 1/2 (NEEDLE) ×1 IMPLANT
NEEDLE HYPO 22GX1.5 SAFETY (NEEDLE) ×2 IMPLANT
NS IRRIG 1000ML POUR BTL (IV SOLUTION) ×2 IMPLANT
PACK HIP COMPR (MISCELLANEOUS) ×2 IMPLANT
PENCIL ELECTRO HAND CTR (MISCELLANEOUS) ×2 IMPLANT
PIN GUIDE 3.2X343MM (PIN) IMPLANT
ROD GUIDE 3.0 (MISCELLANEOUS) IMPLANT
SCREW LAG COMPR KIT 105/100 (Screw) ×1 IMPLANT
SCREW TRIGEN LOW PROF 5.0X47.5 (Screw) ×1 IMPLANT
STAPLER SKIN PROX 35W (STAPLE) ×2 IMPLANT
SUT VIC AB 2-0 CT2 27 (SUTURE) ×2 IMPLANT
SYR 10ML LL (SYRINGE) ×2 IMPLANT
SYR 30ML LL (SYRINGE) ×2 IMPLANT
TAPE CLOTH 3X10 WHT NS LF (GAUZE/BANDAGES/DRESSINGS) ×4 IMPLANT

## 2020-10-01 NOTE — TOC Initial Note (Signed)
Transition of Care [facility]) - Initial/Assessment Note    Patient Details  Name: Kenneth Mccall MRN: [id number] Date of Birth: 1956-09-17  [facility] CM/SW Contact:    Kerin Salen, RN Phone Number: 10/01/2020, 10:28 AM  Clinical Narrative: Spoke with patient and sister who is at bedside. Patient state he lives alone with a homemaker that comes in daily to assist with ADL;s, cooking and light housework. Patient states he does not have license to drive to doctor visits and pharmacy, however sister and cousins lives nearby and assist when needed. Patients PCP and Pharmacy are located in [place]. Uses can and rolling walker for ambulation, also have shower chair. Patient scheduled for surgery today will continue to follow for TOC needs.                  Expected Discharge Plan: Home/Self Care Barriers to Discharge: Continued Medical Work up   Patient Goals and CMS Choice Patient states their goals for this hospitalization and ongoing recovery are:: To return home.   Choice offered to / list presented to : NA  Expected Discharge Plan and Services Expected Discharge Plan: Home/Self Care   Discharge Planning Services: NA Post Acute Care Choice: [place] arrangements for the past 2 months: Apartment                 DME Arranged: N/A DME Agency: NA       HH Arranged: NA [place] Agency: NA        Prior Living Arrangements/Services Living arrangements for the past 2 months: Apartment Lives with:: Self Patient language and need for interpreter reviewed:: Yes Do you feel safe going back to the place where you live?: Yes      Need for Family Participation in Patient Care: Yes (Comment) Care giver support system in place?: Yes (comment)   Criminal Activity/Legal Involvement Pertinent to Current Situation/Hospitalization: No - Comment as needed  Activities of Daily Living Home Assistive Devices/Equipment: Walker (specify type) ADL Screening (condition at time of  admission) Patient's cognitive ability adequate to safely complete daily activities?: Yes Is the patient deaf or have difficulty hearing?: No Does the patient have difficulty seeing, even when wearing glasses/contacts?: No Does the patient have difficulty concentrating, remembering, or making decisions?: No Patient able to express need for assistance with ADLs?: Yes Does the patient have difficulty dressing or bathing?: Yes Independently performs ADLs?: No Communication: Independent Dressing (OT): Needs assistance Is this a change from baseline?: Pre-admission baseline Grooming: Appropriate for developmental age Feeding: Independent Bathing: Needs assistance Is this a change from baseline?: Pre-admission baseline Toileting: Needs assistance Is this a change from baseline?: Pre-admission baseline In/Out Bed: Needs assistance Is this a change from baseline?: Pre-admission baseline Walks in Home: Independent with device (comment) Does the patient have difficulty walking or climbing stairs?: Yes Weakness of Legs: Both Weakness of Arms/Hands: Right  Permission Sought/Granted Permission sought to share information with : Case Manager          Permission granted to share info w Relationship: Sister Kenneth Mccall [phone]     Emotional Assessment Appearance:: Appears stated age Attitude/Demeanor/Rapport: Engaged Affect (typically observed): Accepting Orientation: : Oriented to Self,Oriented to Place,Oriented to  Time Alcohol / Substance Use: Alcohol Use Psych Involvement: No (comment)  Admission diagnosis:  Hypokalemia [E87.6] Hip fracture ([place]) [S72.009A] Left hip pain [M25.552] Fall, initial encounter [W19.XXXA] Closed left hip fracture, initial encounter ([place]) [S72.002A] Sepsis without acute organ dysfunction, due to unspecified organism ([place]) [A41.9]  Patient Active Problem List   Diagnosis Date Noted  . Hip fracture (HCC) 09/30/2020   PCP:  System, Provider  Not In Pharmacy:   [facility] - [place], [place] - [address] [place] [address], [place] [address] Phone: [phone] Fax: [phone]     Social Determinants of Health (SDOH) Interventions    Readmission Risk Interventions No flowsheet data found.

## 2020-10-01 NOTE — Progress Notes (Signed)
PROGRESS NOTE    Kenneth Mccall  [id number] DOB: 1956-07-08 DOA: 09/30/2020 PCP: System, Provider Not In    Assessment & Plan:   Active Problems:   Hip fracture (HCC)   Pseudocholinesterase deficiency   Kenneth Mccall is a 64 y.o. male with medical history significant of  HTN, aortic dissection/ s/p repair,aortic valve repair all in 2012 now with mechanical heart valve on warfarin but has been noncompliant,history of multiple falls due to chronic debility in setting of ETOH abuse,  chronic back pain, HepC,Horseshoe kidney,who presents to ED BIB EMS s/p mechanical fall at home s/p tripping over a rug with injury to his left hip.    Left proximal femur fracture  S/p INTRAMEDULLARY NAIL on 10/01/20 --OR today --pain management  ETOH dependence abuse  -with associated lactic acidosis -started on ciwa with standing and prn ativan Plan: --cont CIWA -mvi bag x 3  Leukocytosis  -possible stress response vs  Occult infection, however, currently no source of infection --procal <0.1 on presentation, however, trended up to 3.01 next day. --started on empiric vanc/cefepime Plan: --cont empiric vanc/cefepime for now  Hx of Aortic valve replacement /dissection repair 2012 reportedly mechanical valve, supposedly on warfarin, however, non-compliant. -patient inr was subtherapeutic on presentation --cardiology consulted Plan: --Hold anticoagulation for surgery  HTN --Home Toprol not started  Episode of Chest pain  Mildly elevated trop due to demand ischemia --trop in 30's flat --CTA chest neg for aneurysm or dissection, neg for PE  Chronic back pain  Anemia, Microcytic  --transfuse to keep Hgb >=7  Horseshoe kidney -cr stable    DVT prophylaxis: SCD/Compression stockings Code Status: Full code  Family Communication:  Status is: inpatient Dispo:   The patient is from: home Anticipated d/c is to: likely SNF Anticipated d/c date is: 2-3 days Patient currently  is not medically stable to d/c due to: just post-op today   Subjective and Interval History:  Pt went for ortho surgery today.  Tolerated it well.     Objective: Vitals:   10/02/20 1616 10/02/20 1645 10/02/20 2035 10/03/20 0110  BP: 139/79 (!) 153/94 (!) 153/88 (!) 143/84  Pulse: 96 98 90 79  Resp: 15  18 19   Temp: 98.9 F (37.2 C) 98.2 F (36.8 C) 98.2 F (36.8 C) 97.8 F (36.6 C)  TempSrc:  Oral    SpO2: 100% 100% 97% 99%  Weight:      Height:        Intake/Output Summary (Last 24 hours) at 10/03/2020 0356 Last data filed at 10/03/2020 0316 Gross per 24 hour  Intake 2403.76 ml  Output 350 ml  Net 2053.76 ml   Filed Weights   10/01/20 0200 10/01/20 2230  Weight: 66.8 kg 73.1 kg    Examination:   Constitutional: NAD, sleeping CV: RRR, 3+ systolic murmurs.  No cyanosis.   RESP: normal respiratory effort, on RA Extremities: No effusions, edema in BLE SKIN: warm, dry and intact   Data Reviewed: I have personally reviewed following labs and imaging studies  CBC: Recent Labs  Lab 09/30/20 1202 09/30/20 1600 09/30/20 1920 10/01/20 0214 10/01/20 0458 10/01/20 1117 10/02/20 0529 10/02/20 1813  WBC 15.6* 14.0*  --   --  10.6*  --  11.0*  --   NEUTROABS 13.1*  --   --   --   --   --   --   --   HGB 11.1* 8.3*   < > 8.0* 8.7* 8.3* 6.4* 7.4*  HCT 34.2* 25.6*  --   --  26.3*  --  19.5* 22.2*  MCV 75.0* 75.5*  --   --  74.5*  --  75.9*  --   PLT 178 129*  --   --  119*  --  105*  --    < > = values in this interval not displayed.   Basic Metabolic Panel: Recent Labs  Lab 09/30/20 1202 09/30/20 1600 10/01/20 0458 10/01/20 1033 10/02/20 0529  NA 133* 131* 134* 132* 131*  K 2.6* 3.2* 2.9* 4.9 3.6  CL 91* 91* 96* 96* 97*  CO2 24 27 31 25 26   GLUCOSE 185* 164* 124* 106* 160*  BUN 8 7* 9 10 13   CREATININE 0.74 0.79 0.69 0.75 0.61  CALCIUM 8.9 7.9* 8.5* 8.9 8.3*  MG 1.1* 1.5*  --   --  1.3*  PHOS  --  3.7  --   --   --    GFR: Estimated Creatinine  Clearance: 93.9 mL/min (by C-G formula based on SCr of 0.61 mg/dL). Liver Function Tests: Recent Labs  Lab 09/30/20 1202 09/30/20 1600  AST 38 38  ALT 14 12  ALKPHOS 76 56  BILITOT 1.2 1.2  PROT 7.2 5.2*  ALBUMIN 3.8 2.8*   No results for input(s): LIPASE, AMYLASE in the last 168 hours. No results for input(s): AMMONIA in the last 168 hours. Coagulation Profile: Recent Labs  Lab 09/30/20 1202  INR 1.0   Cardiac Enzymes: No results for input(s): CKTOTAL, CKMB, CKMBINDEX, TROPONINI in the last 168 hours. BNP (last 3 results) No results for input(s): PROBNP in the last 8760 hours. HbA1C: No results for input(s): HGBA1C in the last 72 hours. CBG: No results for input(s): GLUCAP in the last 168 hours. Lipid Profile: No results for input(s): CHOL, HDL, LDLCALC, TRIG, CHOLHDL, LDLDIRECT in the last 72 hours. Thyroid Function Tests: No results for input(s): TSH, T4TOTAL, FREET4, T3FREE, THYROIDAB in the last 72 hours. Anemia Panel: Recent Labs    09/30/20 1600  TIBC 347  IRON 36*   Sepsis Labs: Recent Labs  Lab 09/30/20 1236 09/30/20 1759 09/30/20 1938 09/30/20 2238 10/01/20 0458 10/02/20 0529  PROCALCITON  --  <0.10  --   --  3.01 3.35  LATICACIDVEN 6.2* 3.2* 4.0* 4.1*  --   --     Recent Results (from the past 240 hour(s))  SARS CORONAVIRUS 2 (TAT 6-24 HRS) Nasopharyngeal Nasopharyngeal Swab     Status: None   Collection Time: 09/30/20 12:02 PM   Specimen: Nasopharyngeal Swab  Result Value Ref Range Status   SARS Coronavirus 2 NEGATIVE NEGATIVE Final    Comment: (NOTE) SARS-CoV-2 target nucleic acids are NOT DETECTED.  The SARS-CoV-2 RNA is generally detectable in upper and lower respiratory specimens during the acute phase of infection. Negative results do not preclude SARS-CoV-2 infection, do not rule out co-infections with other pathogens, and should not be used as the sole basis for treatment or other patient management decisions. Negative results must  be combined with clinical observations, patient history, and epidemiological information. The expected result is Negative.  Fact Sheet for Patients: SugarRoll.be  Fact Sheet for Healthcare Providers: https://www.woods-mathews.com/  This test is not yet approved or cleared by the [place] FDA and  has been authorized for detection and/or diagnosis of SARS-CoV-2 by FDA under an Emergency Use Authorization (EUA). This EUA will remain  in effect (meaning this test can be used) for the duration of the COVID-19 declaration under Se ction 564(b)(1) of the Act, 21 U.S.C. section 360bbb-3(b)(1), unless  the authorization is terminated or revoked sooner.  Performed at [facility], [address], [place], [place] [address]   Culture, blood (single)     Status: None (Preliminary result)   Collection Time: 09/30/20  2:06 PM   Specimen: BLOOD  Result Value Ref Range Status   Specimen Description BLOOD RIGHT ANTECUBITAL  Final   Special Requests   Final    BOTTLES DRAWN AEROBIC AND ANAEROBIC Blood Culture adequate volume   Culture   Final    NO GROWTH 2 DAYS Performed at [facility], [address] [place], [place] [address]    Report Status PENDING  Incomplete      Radiology Studies: ECHOCARDIOGRAM COMPLETE  Result Date: 10/01/2020    ECHOCARDIOGRAM REPORT   Patient Name:   Kenneth Mccall Date of Exam: 10/01/2020 Medical Rec #:  [id number]     Height:       71.0 in Accession #:    [id number]    Weight:       147.3 lb Date of Birth:  01-Apr-1956      BSA:          1.851 m Patient Age:    64 years      BP:           118/88 mmHg Patient Gender: M             HR:           81 bpm. Exam Location:  [facility] Procedure: 2D Echo, Cardiac Doppler and Color Doppler STAT ECHO Indications:     Chest pain 786.50  History:         Patient has no prior history of Echocardiogram examinations.                  Risk Factors:Hypertension. Aortic  dissection.  Sonographer:     Cristela Blue RDCS (AE) Referring Phys:  [id number] SUNNY PATEL Diagnosing Phys: Julien Nordmann MD  Sonographer Comments: Suboptimal parasternal window, no subcostal window and Technically challenging study due to limited acoustic windows. IMPRESSIONS  1. Left ventricular ejection fraction, by estimation, is 55 to 60%. The left ventricle has normal function. The left ventricle has no regional wall motion abnormalities. Left ventricular diastolic parameters are consistent with Grade I diastolic dysfunction (impaired relaxation).  2. Right ventricular systolic function is normal. The right ventricular size is normal. There is normal pulmonary artery systolic pressure. The estimated right ventricular systolic pressure is 12.2 mmHg.  3. Left atrial size was mildly dilated.  4. There is borderline dilatation of the aortic root, measuring 38 mm.  5. The aortic valve was not well visualized. Unable to exclude prothetic aortic valve. Aortic valve mean gradient measures 10.0 mmHg. Aortic valve peak gradient measures 17.9 mmHg. Aortic valve area, by VTI measures 1.85 cm. FINDINGS  Left Ventricle: Left ventricular ejection fraction, by estimation, is 55 to 60%. The left ventricle has normal function. The left ventricle has no regional wall motion abnormalities. The left ventricular internal cavity size was normal in size. There is  no left ventricular hypertrophy. Left ventricular diastolic parameters are consistent with Grade I diastolic dysfunction (impaired relaxation). Right Ventricle: The right ventricular size is normal. No increase in right ventricular wall thickness. Right ventricular systolic function is normal. There is normal pulmonary artery systolic pressure. The tricuspid regurgitant velocity is 1.34 m/s, and  with an assumed right atrial pressure of 5 mmHg, the estimated right ventricular systolic pressure is 12.2 mmHg. Left Atrium:  Left atrial size was mildly dilated. Right Atrium:  Right atrial size was normal in size. Pericardium: There is no evidence of pericardial effusion. Mitral Valve: The mitral valve is normal in structure. No evidence of mitral valve regurgitation. No evidence of mitral valve stenosis. Tricuspid Valve: The tricuspid valve is normal in structure. Tricuspid valve regurgitation is not demonstrated. No evidence of tricuspid stenosis. Aortic Valve: The aortic valve was not well visualized. Aortic valve regurgitation is not visualized. Mild aortic valve sclerosis is present, with no evidence of aortic valve stenosis. Aortic valve mean gradient measures 10.0 mmHg. Aortic valve peak gradient measures 17.9 mmHg. Aortic valve area, by VTI measures 1.85 cm. Pulmonic Valve: The pulmonic valve was normal in structure. Pulmonic valve regurgitation is not visualized. No evidence of pulmonic stenosis. Aorta: The aortic root is normal in size and structure. There is borderline dilatation of the aortic root, measuring 38 mm. Venous: The inferior vena cava is normal in size with greater than 50% respiratory variability, suggesting right atrial pressure of 3 mmHg. IAS/Shunts: No atrial level shunt detected by color flow Doppler.  LEFT VENTRICLE PLAX 2D LVIDd:         3.57 cm  Diastology LVIDs:         2.45 cm  LV e' medial:    5.44 cm/s LV PW:         1.39 cm  LV E/e' medial:  9.7 LV IVS:        0.83 cm  LV e' lateral:   6.42 cm/s LVOT diam:     2.00 cm  LV E/e' lateral: 8.2 LV SV:         52 LV SV Index:   28 LVOT Area:     3.14 cm  RIGHT VENTRICLE RV Basal diam:  2.95 cm RV S prime:     8.38 cm/s TAPSE (M-mode): 3.2 cm LEFT ATRIUM             Index       RIGHT ATRIUM           Index LA diam:        4.90 cm 2.65 cm/m  RA Area:     11.20 cm LA Vol (A2C):   38.9 ml 21.01 ml/m RA Volume:   20.00 ml  10.80 ml/m LA Vol (A4C):   41.9 ml 22.63 ml/m LA Biplane Vol: 44.1 ml 23.82 ml/m  AORTIC VALVE                    PULMONIC VALVE AV Area (Vmax):    1.10 cm     PV Vmax:        0.95 m/s  AV Area (Vmean):   1.13 cm     PV Peak grad:   3.6 mmHg AV Area (VTI):     1.85 cm     RVOT Peak grad: 4 mmHg AV Vmax:           211.33 cm/s AV Vmean:          145.333 cm/s AV VTI:            0.282 m AV Peak Grad:      17.9 mmHg AV Mean Grad:      10.0 mmHg LVOT Vmax:         73.80 cm/s LVOT Vmean:        52.300 cm/s LVOT VTI:          0.166 m LVOT/AV VTI ratio: 0.59  AORTA Ao Root diam: 3.57 cm MITRAL VALVE                TRICUSPID VALVE MV Area (PHT): 4.96 cm     TR Peak grad:   7.2 mmHg MV Decel Time: 153 msec     TR Vmax:        134.00 cm/s MV E velocity: 52.70 cm/s MV A velocity: 109.00 cm/s  SHUNTS MV E/A ratio:  0.48         Systemic VTI:  0.17 m                             Systemic Diam: 2.00 cm Ida Rogue MD Electronically signed by Ida Rogue MD Signature Date/Time: 10/01/2020/9:38:40 AM    Final (Updated)    DG HIP OPERATIVE UNILAT W OR W/O PELVIS LEFT  Result Date: 10/01/2020 CLINICAL DATA:  Intramedullary rod placement EXAM: OPERATIVE left HIP (WITH PELVIS IF PERFORMED) 2 VIEWS TECHNIQUE: Fluoroscopic spot image(s) were submitted for interpretation post-operatively. COMPARISON:  09/30/2020 FINDINGS: 8 fluoroscopic images are obtained during the performance of the procedure and are provided for interpretation only. Intramedullary rod with proximal dynamic screws and distal interlocking screw are seen traversing a comminuted intertrochanteric left hip fracture. Alignment is near anatomic. Please refer to the operative report. FLUOROSCOPY TIME:  2 minutes 41 seconds IMPRESSION: 1. ORIF of an intertrochanteric left hip fracture with near anatomic alignment. Electronically Signed   By: Randa Ngo M.D.   On: 10/01/2020 16:08     Scheduled Meds: . sodium chloride   Intravenous Once  . acetaminophen  1,000 mg Oral Q8H  . docusate sodium  100 mg Oral BID  . enoxaparin (LOVENOX) injection  40 mg Subcutaneous Q24H  . feeding supplement  237 mL Oral TID BM  . folic acid  1 mg Oral  Daily  . ketorolac  7.5 mg Intravenous Q6H  . metoprolol succinate  100 mg Oral Daily  . thiamine  100 mg Oral Daily   Or  . thiamine  100 mg Intravenous Daily   Continuous Infusions: . sodium chloride 75 mL/hr at 10/03/20 0316  . ceFEPime (MAXIPIME) IV Stopped (10/02/20 2239)  . methocarbamol (ROBAXIN) IV    . vancomycin Stopped (10/03/20 0141)     LOS: 3 days     Enzo Bi, MD [facility] If 7PM-7AM, please contact night-coverage 10/03/2020, 3:56 AM

## 2020-10-01 NOTE — Transfer of Care (Signed)
Immediate Anesthesia Transfer of Care Note  Patient: Kenneth Mccall  Procedure(s) Performed: INTRAMEDULLARY (IM) NAIL INTERTROCHANTRIC (Left Hip)  Patient Location: PACU  Anesthesia Type:General  Level of Consciousness: sedated  Airway & Oxygen Therapy: Patient Spontanous Breathing and Patient connected to face mask oxygen  Post-op Assessment: Report given to RN and Post -op Vital signs reviewed and stable  Post vital signs: Reviewed and stable  Last Vitals:  Vitals Value Taken Time  BP 120/76 10/01/20 1614  Temp    Pulse 90 10/01/20 1614  Resp 17 10/01/20 1614  SpO2 100 % 10/01/20 1614  Vitals shown include unvalidated device data.  Last Pain:  Vitals:   10/01/20 1105  TempSrc: Oral  PainSc:          Complications: No complications documented.

## 2020-10-01 NOTE — Progress Notes (Addendum)
PT Cancellation Note  Patient Details Name: Kenneth Mccall MRN: [id number] DOB: 24-May-1956   Cancelled Treatment:    Reason Eval/Treat Not Completed: Other (comment) PT orders received, chart reviewed. Pt noted to have low K+ (2.9) and plans for surgery today. Will hold PT eval. Will complete current PT orders as new ones will be required after surgery is complete.  Aleda Grana, PT, DPT 10/01/20, 8:53 AM    Sandi Mariscal 10/01/2020, 8:51 AM

## 2020-10-01 NOTE — H&P (Signed)
H&P reviewed. See consult note for further updates.

## 2020-10-01 NOTE — Plan of Care (Signed)
  Problem: Education: Goal: Knowledge of General Education information will improve Description: Including pain rating scale, medication(s)/side effects and non-pharmacologic comfort measures 10/01/2020 0045 by Nonie Hoyer, RN Outcome: Progressing 09/30/2020 2159 by Nonie Hoyer, RN Outcome: Progressing   Problem: Health Behavior/Discharge Planning: Goal: Ability to manage health-related needs will improve 10/01/2020 0045 by Nonie Hoyer, RN Outcome: Progressing 09/30/2020 2159 by Nonie Hoyer, RN Outcome: Progressing   Problem: Clinical Measurements: Goal: Ability to maintain clinical measurements within normal limits will improve 10/01/2020 0045 by Nonie Hoyer, RN Outcome: Progressing 09/30/2020 2159 by Nonie Hoyer, RN Outcome: Progressing Goal: Will remain free from infection 10/01/2020 0045 by Nonie Hoyer, RN Outcome: Progressing 09/30/2020 2159 by Nonie Hoyer, RN Outcome: Progressing Goal: Diagnostic test results will improve 10/01/2020 0045 by Nonie Hoyer, RN Outcome: Progressing 09/30/2020 2159 by Nonie Hoyer, RN Outcome: Progressing Goal: Respiratory complications will improve 10/01/2020 0045 by Nonie Hoyer, RN Outcome: Progressing 09/30/2020 2159 by Nonie Hoyer, RN Outcome: Progressing Goal: Cardiovascular complication will be avoided 10/01/2020 0045 by Nonie Hoyer, RN Outcome: Progressing 09/30/2020 2159 by Nonie Hoyer, RN Outcome: Progressing
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
## 2020-10-01 NOTE — Op Note (Signed)
DATE OF SURGERY: 10/01/2020  PREOPERATIVE DIAGNOSIS: Left intertrochanteric hip fracture  POSTOPERATIVE DIAGNOSIS: Left intertrochanteric hip fracture  PROCEDURE: Intramedullary nailing of L femur with cephalomedullary device  SURGEON: Rosealee Albee, MD  ANESTHESIA: Gen  EBL: 100 cc  IVF: per anesthesia record  COMPONENTS:  [employer] Intertan Long Nail: 10x428mm; lag screw with compression screw; 5x 47.41mm distal cortical interlocking screw  INDICATIONS: Kenneth Mccall is a 64 y.o. male who sustained an intertrochanteric fracture after a fall. Patient was deemed medically optimized preoperatively by the Hospitalist and Cardiology teams.  Risks and benefits of intramedullary nailing were explained to the patient and/or family . Risks include but are not limited to bleeding, infection, injury to tissues, nerves, vessels, nonunion/malunion, hardware failure, limb length discrepancy/hip rotation mismatch and risks of anesthesia. The patient and/or family understand these risks, have completed an informed consent, and wish to proceed.   PROCEDURE:  The patient was brought into the operating room. After administering anesthesia, the patient was placed in the supine position on the Hana table. The uninjured leg was placed in an extended position while the injured lower extremity was placed in longitudinal traction. The fracture was reduced using longitudinal traction and internal rotation. The adequacy of reduction was verified fluoroscopically in AP and lateral projections and found to be acceptable.  There appeared to be significant bone loss posteriorly and posteromedially.  The lateral aspect of the hip and thigh were prepped with ChloraPrep solution before being draped sterilely. Preoperative IV antibiotics were administered. A timeout was performed to verify the appropriate surgical site, patient, and procedure.    The greater trochanter was identified and an  approximately 6 cm incision was made about 3 fingerbreadths above the tip of the greater trochanter. The incision was carried down through the subcutaneous tissues to expose the gluteal fascia. This was split the length of the incision, providing access to the tip of the trochanter. Under fluoroscopic guidance, a guidewire was drilled through the tip of the trochanter into the proximal metaphysis to the level of the lesser trochanter. After verifying its position fluoroscopically in AP and lateral projections, it was overreamed with the opening reamer to the level of the lesser trochanter. A guidewire was passed down through the femoral canal to the supracondylar region. The guidewire was overreamed sequentially using the flexible reamers. The nail was selected and advanced to the appropriate depth as verified fluoroscopically.    The guide system for the lag screw was positioned and advanced through an approximately 5cm incision over the lateral aspect of the proximal femur. The guidewire was drilled up through the femoral nail and into the femoral neck to rest within 5 mm of subchondral bone. After verifying its position in the femoral neck and head in both AP and lateral projections, the guidewire was measured and appropriate sized lag screw was selected.  The channel for the compression screw was drilled and antirotation bar was placed.  Lag screw was drilled and placed in appropriate position.  Compression screw was then placed.  Appropriate compression was achieved.  The set screw was locked in place creating a fixed angle construct. Again, the adequacy of hardware position and fracture reduction was verified fluoroscopically in AP and lateral projections.   Attention was directed distally. Using the "perfect circle" technique, the leg and fluoroscopy machine were positioned appropriately. A 2cm stab incision was made over the skin and IT band at the appropriate point before the drill bit was advanced  through  the cortex and across the static hole of the nail. Appropriate screw length was determined with a measuring guide. A distal interlocking screw was placed. Again, the adequacy of screw position was verified fluoroscopically in AP and lateral projections.   The wounds were irrigated thoroughly with sterile saline solution. Local anesthetic was injected into the wounds. Deep fascia was closed with 0-Vicryl. The subcutaneous tissues were closed using 2-0 Vicryl interrupted sutures. The skin was closed using staples. Sterile occlusive dressings were applied to all wounds. The patient was then transferred to the recovery room in satisfactory condition.   POSTOPERATIVE PLAN: The patient will be WBAT on the operative extremity.  Patient is supposed be taking Coumadin for mechanical valve, but has been noncompliant with medications.  Therefore plan for Lovenox 40mg /day x 4 weeks to start on POD#1.  May bridge with Coumadin, but will defer to primary hospitalist and/or/cardiology teams regarding this.  Perioperative IV antibiotics x 24 hours. PT/OT on POD#1.
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
## 2020-10-01 NOTE — Progress Notes (Signed)
Pharmacy Antibiotic Note  Kenneth Mccall is a 64 y.o. male admitted on 09/30/2020 with sepsis.  Pharmacy has been consulted for Cefepime and Vancomycin dosing. No unknown source. Plan for intramedullary nail intertrochanteric 12/31. WBC 10.6, procal 3.01, lactic avid 4.1, currently afeb.  Plan: Cefepime 2g q8H   Pt received vancomycin 1000 mg x 2 (@12 /30 141 and 12/31 @0555 ). Changing vancomycin regimen to 750 mg q8H per [place] nomogram. Goal trough 15-20. Plan to get vancomycin level in the next 1-2 days.   Height: 5\' 11"  (180.3 cm) Weight: 66.8 kg (147 lb 4.3 oz) IBW/kg (Calculated) : 75.3  Temp (24hrs), Avg:97.9 F (36.6 C), Min:97.5 F (36.4 C), Max:98.1 F (36.7 C)  Recent Labs  Lab 09/30/20 1202 09/30/20 1236 09/30/20 1600 09/30/20 1759 09/30/20 1938 09/30/20 2238 10/01/20 0458  WBC 15.6*  --  14.0*  --   --   --  10.6*  CREATININE 0.74  --  0.79  --   --   --  0.69  LATICACIDVEN  --  6.2*  --  3.2* 4.0* 4.1*  --     Estimated Creatinine Clearance: 85.8 mL/min (by C-G formula based on SCr of 0.69 mg/dL).    Allergies  Allergen Reactions  . Anectine [Succinylcholine Chloride] Anaphylaxis    Antimicrobials this admission: 12/30 Cefazolin >> x1 12/30 Cefepime >>  12/30 Vancomycin >>   Microbiology results: 12/30 BCx: Pending  Thank you for allowing pharmacy to be a part of this patient's care.  1/31, PharmD 10/01/2020 10:07 AM
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
## 2020-10-01 NOTE — Anesthesia Procedure Notes (Signed)
Procedure Name: Intubation Date/Time: 10/01/2020 2:14 PM Performed by: Omer Jack, CRNA Pre-anesthesia Checklist: Patient identified, Patient being monitored, Timeout performed, Emergency Drugs available and Suction available Patient Re-evaluated:Patient Re-evaluated prior to induction Oxygen Delivery Method: Circle system utilized Preoxygenation: Pre-oxygenation with 100% oxygen Induction Type: IV induction Ventilation: Mask ventilation without difficulty Laryngoscope Size: McGraph and 4 Grade View: Grade I Tube type: Oral Tube size: 7.5 mm Number of attempts: 1 Airway Equipment and Method: Stylet Placement Confirmation: ETT inserted through vocal cords under direct vision,  positive ETCO2 and breath sounds checked- equal and bilateral Secured at: 22 cm Tube secured with: Tape Dental Injury: Teeth and Oropharynx as per pre-operative assessment

## 2020-10-01 NOTE — Progress Notes (Signed)
OT Cancellation Note  Patient Details Name: Kenneth Mccall MRN: [id number] DOB: 1955-11-14   Cancelled Treatment:    Reason Eval/Treat Not Completed: Medical issues which prohibited therapy;Other (comment) OT orders received, chart reviewed. Pt noted to have low K+ (2.9) and plans for surgery today. Will hold OT eval until after surgery. Will complete current OT orders as new ones will be required after surgery is complete.  Rockney Ghee, M.S., OTR/L Ascom: [phone] 10/01/20, 8:54 AM

## 2020-10-01 NOTE — Plan of Care (Addendum)
Pt presented with hip fracture pending surgical fixation this morning.  Trop just mildly elevated on presentation, in 30's and remained flat, so likely slight demand ischemia, and not concerning for ACS.    Pt had low K and Mag, which are currently getting repleted, and expected to be close to normal prior to scheduled surgery.  Lactic acid elevated on presentation, which could be related to alcohol use, pt is currently on IVF.  Pt had leukocytosis on presentation, however, procal was neg on presentation.  CTA chest and UA neg for infection.  Likely reactive to acute fracture.  Pt was started on empiric abx on presentation.  Per H&P, pt has a mechanical heart valve, however, noncompliant with warfarin.  Pt is at risk of thrombus formation, however, not much we can do about that prior to ortho surgery, so will start anticoagulation after surgery.  Overall, the benefit of surgical fixation of his left hip fracture right away outweighs risk.  As long as electrolytes approaching normal, pt can go for surgery today.

## 2020-10-01 NOTE — Progress Notes (Addendum)
Initial Nutrition Assessment  DOCUMENTATION CODES:   Not applicable  INTERVENTION:   Ensure Enlive po TID, each supplement provides 350 kcal and 20 grams of protein  MVI, folic acid and thiamine daily in setting of etoh abuse  Pt at high refeed risk; recommend monitor potassium, magnesium and phosphorus labs daily as oral intake improves.   NUTRITION DIAGNOSIS:   Increased nutrient needs related to hip fracture as evidenced by estimated needs.  GOAL:   Patient will meet greater than or equal to 90% of their needs  MONITOR:   Supplement acceptance,PO intake,Labs,Weight trends,Skin,I & O's  REASON FOR ASSESSMENT:   Consult Hip fracture protocol  ASSESSMENT:   64 y.o. male with medical history significant of  HTN, aortic dissection s/p repairaortic valve repair all in 2012 now with mechanical heart valve on warfarin but has been noncompliant, multiple falls due to chronic debility in setting of ETOH abuse, chronic back pain, HepC and Horseshoe kidney who presents with L hip fracture s/p mechanical fall   Unable to see pt today as pt in surgery at time of RD visit. Pt with increased estimated needs r/t hip fracture. Suspect pt with decreased appetite and oral intake at baseline r/t etoh abuse. Pt NPO today for surgery. RD will add supplements to help pt meet his estimated needs. Pt is at high refeed risk. RD will obtain nutrition related history and exam at follow-up.   Medications reviewed and include: folic acid, MVI, thiamine, NaCl @75ml /hr, cefepime, vancomycin   Labs reviewed: Na 132(L) Wbc- 10.6(H), Hgb 8.3(L), Hct 26.3(L), MCV 74.5(L), MCH 24.6(L)  NUTRITION - FOCUSED PHYSICAL EXAM: Unable to perform at this time   Diet Order:   Diet Order            Diet NPO time specified  Diet effective midnight           Diet NPO time specified  Diet effective now                EDUCATION NEEDS:   Not appropriate for education at this time  Skin:  Skin Assessment:  Reviewed RN Assessment  Last BM:  12/30  Height:   Ht Readings from Last 1 Encounters:  10/01/20 5\' 11"  (1.803 m)    Weight:   Wt Readings from Last 1 Encounters:  10/01/20 66.8 kg    Ideal Body Weight:  78 kg  BMI:  Body mass index is 20.54 kg/m.  Estimated Nutritional Needs:   Kcal:  1900-2200kcal/day  Protein:  95-110g/day  Fluid:  1.9-2.1L/day  MS, RD, LDN Please refer to [facility] for RD and/or RD on-call/weekend/after hours pager

## 2020-10-01 NOTE — Progress Notes (Signed)
*  PRELIMINARY RESULTS* Echocardiogram 2D Echocardiogram has been performed.  Cristela Blue 10/01/2020, 8:16 AM

## 2020-10-01 NOTE — Progress Notes (Signed)
Pharmacy Antibiotic Note  Kenneth Mccall is a 64 y.o. male admitted on 09/30/2020 with sepsis.  Pharmacy has been consulted for Cefepime and Vancomycin dosing.  On 12/30, pt given 1 time doses of Cefazolin, Cefepime, and Vancomycin.  Plan: Per Abx guidelines, based on pt CrCl and wt: Ordered Cefepime 2 gm q8hr base on CrCl. Ordered Vancomycin 1gm q12hr, in place of 750mg  q8h per vanc dosing nomogram due to pt age. Pharmacy will monitor for lab culture results. Pharmacy will continue following CrCl and will adjust Abx dosing if warranted.  Height: 5\' 11"  (180.3 cm) Weight: 66.8 kg (147 lb 4.3 oz) IBW/kg (Calculated) : 75.3  Temp (24hrs), Avg:97.9 F (36.6 C), Min:97.5 F (36.4 C), Max:98.1 F (36.7 C)  Recent Labs  Lab 09/30/20 1202 09/30/20 1236 09/30/20 1600 09/30/20 1759 09/30/20 1938 09/30/20 2238  WBC 15.6*  --  14.0*  --   --   --   CREATININE 0.74  --  0.79  --   --   --   LATICACIDVEN  --  6.2*  --  3.2* 4.0* 4.1*    Estimated Creatinine Clearance: 85.8 mL/min (by C-G formula based on SCr of 0.79 mg/dL).    Allergies  Allergen Reactions  . Anectine [Succinylcholine Chloride] Anaphylaxis    Antimicrobials this admission: 12/30 Cefazolin >> x1 12/30 Cefepime >>  12/30 Vancomycin >>   Microbiology results: 12/30 BCx: Pending  Thank you for allowing pharmacy to be a part of this patient's care.  1/31, PharmD, [facility] 10/01/2020 3:54 AM

## 2020-10-01 NOTE — Progress Notes (Addendum)
HOSPITAL MEDICINE OVERNIGHT EVENT NOTE    Notified by nursing that anesthesia is requesting that potassium be corrected prior to patient going to the operating room at 10 AM.  Potassium currently 2.9.  Patient also had concurrent hypomagnesemia yesterday which was replaced yesterday with 2 g of intravenous magnesium sulfate.  Ordering 40 mEq of oral potassium in addition to 60 mEq of intravenous potassium now.  Marinda Elk  MD [facility]

## 2020-10-02 DIAGNOSIS — S72002A Fracture of unspecified part of neck of left femur, initial encounter for closed fracture: Secondary | ICD-10-CM | POA: Diagnosis not present

## 2020-10-02 LAB — ABO/RH: ABO/RH(D): O POS

## 2020-10-02 LAB — BASIC METABOLIC PANEL
Anion gap: 8 (ref 5–15)
BUN: 13 mg/dL (ref 8–23)
CO2: 26 mmol/L (ref 22–32)
Calcium: 8.3 mg/dL — ABNORMAL LOW (ref 8.9–10.3)
Chloride: 97 mmol/L — ABNORMAL LOW (ref 98–111)
Creatinine, Ser: 0.61 mg/dL (ref 0.61–1.24)
GFR, Estimated: >60 mL/min (ref >60)
Glucose, Bld: 160 mg/dL — ABNORMAL HIGH (ref 70–99)
Potassium: 3.6 mmol/L (ref 3.5–5.1)
Sodium: 131 mmol/L — ABNORMAL LOW (ref 135–145)

## 2020-10-02 LAB — MAGNESIUM: Magnesium: 1.3 mg/dL — ABNORMAL LOW (ref 1.7–2.4)

## 2020-10-02 LAB — PREPARE RBC (CROSSMATCH)

## 2020-10-02 LAB — HEMOGLOBIN AND HEMATOCRIT, BLOOD
HCT: 22.2 % — ABNORMAL LOW (ref 39.0–52.0)
Hemoglobin: 7.4 g/dL — ABNORMAL LOW (ref 13.0–17.0)

## 2020-10-02 LAB — CBC
HCT: 19.5 % — ABNORMAL LOW (ref 39.0–52.0)
Hemoglobin: 6.4 g/dL — ABNORMAL LOW (ref 13.0–17.0)
MCH: 24.9 pg — ABNORMAL LOW (ref 26.0–34.0)
MCHC: 32.8 g/dL (ref 30.0–36.0)
MCV: 75.9 fL — ABNORMAL LOW (ref 80.0–100.0)
Platelets: 105 10*3/uL — ABNORMAL LOW (ref 150–400)
RBC: 2.57 MIL/uL — ABNORMAL LOW (ref 4.22–5.81)
RDW: 18.6 % — ABNORMAL HIGH (ref 11.5–15.5)
WBC: 11 10*3/uL — ABNORMAL HIGH (ref 4.0–10.5)
nRBC: 0 % (ref 0.0–0.2)

## 2020-10-02 LAB — PROCALCITONIN: Procalcitonin: 3.35 ng/mL

## 2020-10-02 MED ORDER — TRAMADOL HCL 50 MG PO TABS: 50.0000 mg | ORAL_TABLET | Freq: Four times a day (QID) | ORAL | Status: DC | PRN

## 2020-10-02 MED ORDER — SENNOSIDES-DOCUSATE SODIUM 8.6-50 MG PO TABS: 1.0000 | ORAL_TABLET | Freq: Every evening | ORAL | Status: DC | PRN

## 2020-10-02 MED ORDER — KETOROLAC TROMETHAMINE 15 MG/ML IJ SOLN
7.5000 mg | Freq: Four times a day (QID) | INTRAMUSCULAR | Status: AC
  Administered 2020-10-02 – 2020-10-03 (×4): 7.5 mg via INTRAVENOUS
  Filled 2020-10-02 (×4): qty 1

## 2020-10-02 MED ORDER — METHOCARBAMOL 500 MG PO TABS
500.0000 mg | ORAL_TABLET | Freq: Four times a day (QID) | ORAL | Status: DC | PRN
  Administered 2020-10-04: 500 mg via ORAL
  Filled 2020-10-02: qty 1

## 2020-10-02 MED ORDER — ACETAMINOPHEN 500 MG PO TABS
1000.0000 mg | ORAL_TABLET | Freq: Three times a day (TID) | ORAL | Status: DC
  Administered 2020-10-02 – 2020-10-07 (×13): 1000 mg via ORAL
  Filled 2020-10-02 (×17): qty 2

## 2020-10-02 MED ORDER — SODIUM CHLORIDE 0.9 % IV SOLN: INTRAVENOUS | Status: DC

## 2020-10-02 MED ORDER — OXYCODONE HCL 5 MG PO TABS: 2.5000 mg | ORAL_TABLET | ORAL | Status: DC | PRN

## 2020-10-02 MED ORDER — METHOCARBAMOL 1000 MG/10ML IJ SOLN
500.0000 mg | Freq: Four times a day (QID) | INTRAVENOUS | Status: DC | PRN
  Filled 2020-10-02: qty 5

## 2020-10-02 MED ORDER — ONDANSETRON HCL 4 MG/2ML IJ SOLN
4.0000 mg | Freq: Four times a day (QID) | INTRAMUSCULAR | Status: DC | PRN
  Administered 2020-10-03 – 2020-10-06 (×4): 4 mg via INTRAVENOUS
  Filled 2020-10-02 (×4): qty 2

## 2020-10-02 MED ORDER — BISACODYL 10 MG RE SUPP: 10.0000 mg | Freq: Every day | RECTAL | Status: DC | PRN

## 2020-10-02 MED ORDER — METOCLOPRAMIDE HCL 5 MG/ML IJ SOLN
5.0000 mg | Freq: Three times a day (TID) | INTRAMUSCULAR | Status: DC | PRN
  Administered 2020-10-05 – 2020-10-06 (×2): 10 mg via INTRAVENOUS
  Filled 2020-10-02 (×2): qty 2

## 2020-10-02 MED ORDER — METOPROLOL SUCCINATE ER 50 MG PO TB24
100.0000 mg | ORAL_TABLET | Freq: Every day | ORAL | Status: DC
  Administered 2020-10-03 – 2020-10-08 (×6): 100 mg via ORAL
  Filled 2020-10-02 (×6): qty 2

## 2020-10-02 MED ORDER — ENOXAPARIN SODIUM 40 MG/0.4ML ~~LOC~~ SOLN
40.0000 mg | SUBCUTANEOUS | Status: DC
  Administered 2020-10-03 – 2020-10-05 (×3): 40 mg via SUBCUTANEOUS
  Filled 2020-10-02 (×4): qty 0.4

## 2020-10-02 MED ORDER — OXYCODONE HCL 5 MG PO TABS
5.0000 mg | ORAL_TABLET | ORAL | Status: DC | PRN
  Administered 2020-10-03: 10 mg via ORAL
  Filled 2020-10-02: qty 2

## 2020-10-02 MED ORDER — ONDANSETRON HCL 4 MG PO TABS: 4.0000 mg | ORAL_TABLET | Freq: Four times a day (QID) | ORAL | Status: DC | PRN

## 2020-10-02 MED ORDER — FLEET ENEMA 7-19 GM/118ML RE ENEM: 1.0000 | ENEMA | Freq: Once | RECTAL | Status: DC | PRN

## 2020-10-02 MED ORDER — SODIUM CHLORIDE 0.9% IV SOLUTION
Freq: Once | INTRAVENOUS | Status: DC
Start: 2020-10-02 — End: 2020-10-09

## 2020-10-02 MED ORDER — DOCUSATE SODIUM 100 MG PO CAPS
100.0000 mg | ORAL_CAPSULE | Freq: Two times a day (BID) | ORAL | Status: DC
  Administered 2020-10-02 – 2020-10-03 (×2): 100 mg via ORAL
  Filled 2020-10-02 (×8): qty 1

## 2020-10-02 MED ORDER — MAGNESIUM SULFATE 2 GM/50ML IV SOLN
2.0000 g | INTRAVENOUS | Status: AC
  Administered 2020-10-02 (×3): 2 g via INTRAVENOUS
  Filled 2020-10-02 (×2): qty 50

## 2020-10-02 MED ORDER — ENOXAPARIN SODIUM 40 MG/0.4ML ~~LOC~~ SOLN
40.0000 mg | SUBCUTANEOUS | Status: DC
  Administered 2020-10-02: 40 mg via SUBCUTANEOUS
  Filled 2020-10-02: qty 0.4

## 2020-10-02 MED ORDER — HYDROMORPHONE HCL 1 MG/ML IJ SOLN: 0.2500 mg | INTRAMUSCULAR | Status: DC | PRN

## 2020-10-02 MED ORDER — METOCLOPRAMIDE HCL 10 MG PO TABS
5.0000 mg | ORAL_TABLET | Freq: Three times a day (TID) | ORAL | Status: DC | PRN
  Administered 2020-10-04: 10 mg via ORAL
  Filled 2020-10-02: qty 1
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
## 2020-10-02 NOTE — Progress Notes (Signed)
PROGRESS NOTE    Kenneth Mccall  [id number] DOB: [birth year] DOA: 09/30/2020 PCP: System, Provider Not In    Assessment & Plan:   Active Problems:   Hip fracture (HCC)   Pseudocholinesterase deficiency   Kenneth Mccall is a 65 y.o. male with medical history significant of  HTN, aortic dissection/ s/p repair,aortic valve repair all in 2012 now with mechanical heart valve on warfarin but has been noncompliant,history of multiple falls due to chronic debility in setting of ETOH abuse,  chronic back pain, HepC,Horseshoe kidney,who presents to ED BIB EMS s/p mechanical fall at home s/p tripping over a rug with injury to his left hip.    Left proximal femur fracture  S/p INTRAMEDULLARY NAIL on 10/01/20 --pain management --ensure BM --Weight-Bearing as tolerated to left leg --PT eval  ETOH dependence abuse  -with associated lactic acidosis -started on ciwa with standing and prn ativan Plan: --cont CIWA --thiamine  Leukocytosis  -possible stress response vs  Occult infection, however, currently no source of infection --procal <0.1 on presentation, however, trended up to 3.01, 3.35 --started on empiric vanc/cefepime Plan: --cont empiric vanc/cefepime for now --f/u blood cx (only 1 obtained)  Hx of Aortic valve replacement /dissection repair 2012 reportedly mechanical valve, supposedly on warfarin, however, non-compliant. -patient inr was subtherapeutic on presentation --cardiology consulted Plan: --defer to cardiology regarding anticoagulation  HTN --resume home Toprol tomorrow  Episode of Chest pain  Mildly elevated trop due to demand ischemia --trop in 30's flat --CTA chest neg for aneurysm or dissection, neg for PE  Chronic back pain  Anemia, Microcytic, blood loss from surgery --Hgb dropped to 6.4 today --1u pRBC today --transfuse to keep Hgb >=7  Horseshoe kidney -cr stable    DVT prophylaxis: SCD/Compression stockings Code Status: Full code   Family Communication: sister updated at bedside today Status is: inpatient Dispo:   The patient is from: home Anticipated d/c is to: likely SNF Anticipated d/c date is: 1-2 days Patient currently is not medically stable to d/c due to: post-op, pending PT eval   Subjective and Interval History:  Pt was asleep but woke up to complain of a lot of pain on his left side and requested more pain meds.  No BM yet.   Objective: Vitals:   10/02/20 1645 10/02/20 2035 10/03/20 0110 10/03/20 0428  BP: (!) 153/94 (!) 153/88 (!) 143/84 (!) 146/99  Pulse: 98 90 79 79  Resp:  18 19   Temp: 98.2 F (36.8 C) 98.2 F (36.8 C) 97.8 F (36.6 C) 97.8 F (36.6 C)  TempSrc: Oral     SpO2: 100% 97% 99% 100%  Weight:      Height:        Intake/Output Summary (Last 24 hours) at 10/03/2020 0656 Last data filed at 10/03/2020 0316 Gross per 24 hour  Intake 1255.16 ml  Output --  Net 1255.16 ml   Filed Weights   10/01/20 0200 10/01/20 2230  Weight: 66.8 kg 73.1 kg    Examination:   Constitutional: NAD, AAOx3 HEENT: conjunctivae and lids normal, EOMI CV: No cyanosis.   RESP: normal respiratory effort, on RA Extremities: No effusions, edema in BLE.   SKIN: warm, dry and intact Neuro: II - XII grossly intact.     Data Reviewed: I have personally reviewed following labs and imaging studies  CBC: Recent Labs  Lab 09/30/20 1202 09/30/20 1600 09/30/20 1920 10/01/20 0214 10/01/20 0458 10/01/20 1117 10/02/20 0529 10/02/20 1813  WBC 15.6* 14.0*  --   --  10.6*  --  11.0*  --   NEUTROABS 13.1*  --   --   --   --   --   --   --   HGB 11.1* 8.3*   < > 8.0* 8.7* 8.3* 6.4* 7.4*  HCT 34.2* 25.6*  --   --  26.3*  --  19.5* 22.2*  MCV 75.0* 75.5*  --   --  74.5*  --  75.9*  --   PLT 178 129*  --   --  119*  --  105*  --    < > = values in this interval not displayed.   Basic Metabolic Panel: Recent Labs  Lab 09/30/20 1202 09/30/20 1600 10/01/20 0458 10/01/20 1033 10/02/20 0529  NA 133*  131* 134* 132* 131*  K 2.6* 3.2* 2.9* 4.9 3.6  CL 91* 91* 96* 96* 97*  CO2 24 27 31 25 26   GLUCOSE 185* 164* 124* 106* 160*  BUN 8 7* 9 10 13   CREATININE 0.74 0.79 0.69 0.75 0.61  CALCIUM 8.9 7.9* 8.5* 8.9 8.3*  MG 1.1* 1.5*  --   --  1.3*  PHOS  --  3.7  --   --   --    GFR: Estimated Creatinine Clearance: 93.9 mL/min (by C-G formula based on SCr of 0.61 mg/dL). Liver Function Tests: Recent Labs  Lab 09/30/20 1202 09/30/20 1600  AST 38 38  ALT 14 12  ALKPHOS 76 56  BILITOT 1.2 1.2  PROT 7.2 5.2*  ALBUMIN 3.8 2.8*   No results for input(s): LIPASE, AMYLASE in the last 168 hours. No results for input(s): AMMONIA in the last 168 hours. Coagulation Profile: Recent Labs  Lab 09/30/20 1202  INR 1.0   Cardiac Enzymes: No results for input(s): CKTOTAL, CKMB, CKMBINDEX, TROPONINI in the last 168 hours. BNP (last 3 results) No results for input(s): PROBNP in the last 8760 hours. HbA1C: No results for input(s): HGBA1C in the last 72 hours. CBG: No results for input(s): GLUCAP in the last 168 hours. Lipid Profile: No results for input(s): CHOL, HDL, LDLCALC, TRIG, CHOLHDL, LDLDIRECT in the last 72 hours. Thyroid Function Tests: No results for input(s): TSH, T4TOTAL, FREET4, T3FREE, THYROIDAB in the last 72 hours. Anemia Panel: Recent Labs    09/30/20 1600  TIBC 347  IRON 36*   Sepsis Labs: Recent Labs  Lab 09/30/20 1236 09/30/20 1759 09/30/20 1938 09/30/20 2238 10/01/20 0458 10/02/20 0529  PROCALCITON  --  <0.10  --   --  3.01 3.35  LATICACIDVEN 6.2* 3.2* 4.0* 4.1*  --   --     Recent Results (from the past 240 hour(s))  SARS CORONAVIRUS 2 (TAT 6-24 HRS) Nasopharyngeal Nasopharyngeal Swab     Status: None   Collection Time: 09/30/20 12:02 PM   Specimen: Nasopharyngeal Swab  Result Value Ref Range Status   SARS Coronavirus 2 NEGATIVE NEGATIVE Final    Comment: (NOTE) SARS-CoV-2 target nucleic acids are NOT DETECTED.  The SARS-CoV-2 RNA is generally  detectable in upper and lower respiratory specimens during the acute phase of infection. Negative results do not preclude SARS-CoV-2 infection, do not rule out co-infections with other pathogens, and should not be used as the sole basis for treatment or other patient management decisions. Negative results must be combined with clinical observations, patient history, and epidemiological information. The expected result is Negative.  Fact Sheet for Patients: SugarRoll.be  Fact Sheet for Healthcare Providers: https://www.woods-mathews.com/  This test is not yet approved or cleared by  the [employer] [place] FDA and  has been authorized for detection and/or diagnosis of SARS-CoV-2 by FDA under an Emergency Use Authorization (EUA). This EUA will remain  in effect (meaning this test can be used) for the duration of the COVID-19 declaration under Se ction 564(b)(1) of the Act, 21 U.S.C. section 360bbb-3(b)(1), unless the authorization is terminated or revoked sooner.  Performed at [facility], [address] [place], [place] [address]   Culture, blood (single)     Status: None (Preliminary result)   Collection Time: 09/30/20  2:06 PM   Specimen: BLOOD  Result Value Ref Range Status   Specimen Description BLOOD RIGHT ANTECUBITAL  Final   Special Requests   Final    BOTTLES DRAWN AEROBIC AND ANAEROBIC Blood Culture adequate volume   Culture   Final    NO GROWTH 3 DAYS Performed at [facility], [address] [place], [place] [address]    Report Status PENDING  Incomplete      Radiology Studies: ECHOCARDIOGRAM COMPLETE  Result Date: 10/01/2020    ECHOCARDIOGRAM REPORT   Patient Name:   Kenneth PuckerJESSIE Mccall Date of Exam: 10/01/2020 Medical Rec #:  [id number]     Height:       71.0 in Accession #:    [id number]    Weight:       147.3 lb Date of Birth:  04/15/1956      BSA:          1.851 m Patient Age:    64 years      BP:           118/88  mmHg Patient Gender: M             HR:           81 bpm. Exam Location:  [facility] Procedure: 2D Echo, Cardiac Doppler and Color Doppler STAT ECHO Indications:     Chest pain 786.50  History:         Patient has no prior history of Echocardiogram examinations.                  Risk Factors:Hypertension. Aortic dissection.  Sonographer:     Cristela BlueJerry Hege RDCS (AE) Referring Phys:  [id number] SUNNY PATEL Diagnosing Phys: Julien Nordmannimothy Gollan MD  Sonographer Comments: Suboptimal parasternal window, no subcostal window and Technically challenging study due to limited acoustic windows. IMPRESSIONS  1. Left ventricular ejection fraction, by estimation, is 55 to 60%. The left ventricle has normal function. The left ventricle has no regional wall motion abnormalities. Left ventricular diastolic parameters are consistent with Grade I diastolic dysfunction (impaired relaxation).  2. Right ventricular systolic function is normal. The right ventricular size is normal. There is normal pulmonary artery systolic pressure. The estimated right ventricular systolic pressure is 12.2 mmHg.  3. Left atrial size was mildly dilated.  4. There is borderline dilatation of the aortic root, measuring 38 mm.  5. The aortic valve was not well visualized. Unable to exclude prothetic aortic valve. Aortic valve mean gradient measures 10.0 mmHg. Aortic valve peak gradient measures 17.9 mmHg. Aortic valve area, by VTI measures 1.85 cm. FINDINGS  Left Ventricle: Left ventricular ejection fraction, by estimation, is 55 to 60%. The left ventricle has normal function. The left ventricle has no regional wall motion abnormalities. The left ventricular internal cavity size was normal in size. There is  no left ventricular hypertrophy. Left ventricular diastolic parameters are consistent with Grade I diastolic dysfunction (impaired relaxation). Right Ventricle:  The right ventricular size is normal. No increase in right ventricular wall thickness. Right ventricular systolic  function is normal. There is normal pulmonary artery systolic pressure. The tricuspid regurgitant velocity is 1.34 m/s, and  with an assumed right atrial pressure of 5 mmHg, the estimated right ventricular systolic pressure is 123456 mmHg. Left Atrium: Left atrial size was mildly dilated. Right Atrium: Right atrial size was normal in size. Pericardium: There is no evidence of pericardial effusion. Mitral Valve: The mitral valve is normal in structure. No evidence of mitral valve regurgitation. No evidence of mitral valve stenosis. Tricuspid Valve: The tricuspid valve is normal in structure. Tricuspid valve regurgitation is not demonstrated. No evidence of tricuspid stenosis. Aortic Valve: The aortic valve was not well visualized. Aortic valve regurgitation is not visualized. Mild aortic valve sclerosis is present, with no evidence of aortic valve stenosis. Aortic valve mean gradient measures 10.0 mmHg. Aortic valve peak gradient measures 17.9 mmHg. Aortic valve area, by VTI measures 1.85 cm. Pulmonic Valve: The pulmonic valve was normal in structure. Pulmonic valve regurgitation is not visualized. No evidence of pulmonic stenosis. Aorta: The aortic root is normal in size and structure. There is borderline dilatation of the aortic root, measuring 38 mm. Venous: The inferior vena cava is normal in size with greater than 50% respiratory variability, suggesting right atrial pressure of 3 mmHg. IAS/Shunts: No atrial level shunt detected by color flow Doppler.  LEFT VENTRICLE PLAX 2D LVIDd:         3.57 cm  Diastology LVIDs:         2.45 cm  LV e' medial:    5.44 cm/s LV PW:         1.39 cm  LV E/e' medial:  9.7 LV IVS:        0.83 cm  LV e' lateral:   6.42 cm/s LVOT diam:     2.00 cm  LV E/e' lateral: 8.2 LV SV:         52 LV SV Index:   28 LVOT Area:     3.14 cm  RIGHT VENTRICLE RV Basal diam:  2.95 cm RV S prime:     8.38 cm/s TAPSE (M-mode): 3.2 cm LEFT ATRIUM             Index       RIGHT ATRIUM           Index LA  diam:        4.90 cm 2.65 cm/m  RA Area:     11.20 cm LA Vol (A2C):   38.9 ml 21.01 ml/m RA Volume:   20.00 ml  10.80 ml/m LA Vol (A4C):   41.9 ml 22.63 ml/m LA Biplane Vol: 44.1 ml 23.82 ml/m  AORTIC VALVE                    PULMONIC VALVE AV Area (Vmax):    1.10 cm     PV Vmax:        0.95 m/s AV Area (Vmean):   1.13 cm     PV Peak grad:   3.6 mmHg AV Area (VTI):     1.85 cm     RVOT Peak grad: 4 mmHg AV Vmax:           211.33 cm/s AV Vmean:          145.333 cm/s AV VTI:            0.282 m AV Peak Grad:  17.9 mmHg AV Mean Grad:      10.0 mmHg LVOT Vmax:         73.80 cm/s LVOT Vmean:        52.300 cm/s LVOT VTI:          0.166 m LVOT/AV VTI ratio: 0.59  AORTA Ao Root diam: 3.57 cm MITRAL VALVE                TRICUSPID VALVE MV Area (PHT): 4.96 cm     TR Peak grad:   7.2 mmHg MV Decel Time: 153 msec     TR Vmax:        134.00 cm/s MV E velocity: 52.70 cm/s MV A velocity: 109.00 cm/s  SHUNTS MV E/A ratio:  0.48         Systemic VTI:  0.17 m                             Systemic Diam: 2.00 cm Julien Nordmann MD Electronically signed by Julien Nordmann MD Signature Date/Time: 10/01/2020/9:38:40 AM    Final (Updated)    DG HIP OPERATIVE UNILAT W OR W/O PELVIS LEFT  Result Date: 10/01/2020 CLINICAL DATA:  Intramedullary rod placement EXAM: OPERATIVE left HIP (WITH PELVIS IF PERFORMED) 2 VIEWS TECHNIQUE: Fluoroscopic spot image(s) were submitted for interpretation post-operatively. COMPARISON:  09/30/2020 FINDINGS: 8 fluoroscopic images are obtained during the performance of the procedure and are provided for interpretation only. Intramedullary rod with proximal dynamic screws and distal interlocking screw are seen traversing a comminuted intertrochanteric left hip fracture. Alignment is near anatomic. Please refer to the operative report. FLUOROSCOPY TIME:  2 minutes 41 seconds IMPRESSION: 1. ORIF of an intertrochanteric left hip fracture with near anatomic alignment. Electronically Signed   By: Sharlet Salina M.D.   On: 10/01/2020 16:08     Scheduled Meds: . sodium chloride   Intravenous Once  . acetaminophen  1,000 mg Oral Q8H  . docusate sodium  100 mg Oral BID  . enoxaparin (LOVENOX) injection  40 mg Subcutaneous Q24H  . feeding supplement  237 mL Oral TID BM  . folic acid  1 mg Oral Daily  . ketorolac  7.5 mg Intravenous Q6H  . metoprolol succinate  100 mg Oral Daily  . thiamine  100 mg Oral Daily   Or  . thiamine  100 mg Intravenous Daily   Continuous Infusions: . sodium chloride 75 mL/hr at 10/03/20 0316  . ceFEPime (MAXIPIME) IV 2 g (10/03/20 0525)  . methocarbamol (ROBAXIN) IV    . vancomycin 750 mg (10/03/20 0530)     LOS: 3 days     Darlin Priestly, MD [facility] If 7PM-7AM, please contact night-coverage 10/03/2020, 6:56 AM

## 2020-10-03 DIAGNOSIS — S72002A Fracture of unspecified part of neck of left femur, initial encounter for closed fracture: Secondary | ICD-10-CM | POA: Diagnosis not present

## 2020-10-03 LAB — BASIC METABOLIC PANEL
Anion gap: 5 (ref 5–15)
BUN: 16 mg/dL (ref 8–23)
CO2: 28 mmol/L (ref 22–32)
Calcium: 7.8 mg/dL — ABNORMAL LOW (ref 8.9–10.3)
Chloride: 100 mmol/L (ref 98–111)
Creatinine, Ser: 0.7 mg/dL (ref 0.61–1.24)
GFR, Estimated: >60 mL/min (ref >60)
Glucose, Bld: 105 mg/dL — ABNORMAL HIGH (ref 70–99)
Potassium: 3.1 mmol/L — ABNORMAL LOW (ref 3.5–5.1)
Sodium: 133 mmol/L — ABNORMAL LOW (ref 135–145)

## 2020-10-03 LAB — TYPE AND SCREEN
ABO/RH(D): O POS
Antibody Screen: NEGATIVE
Unit division: 0

## 2020-10-03 LAB — BPAM RBC
Blood Product Expiration Date: 202202022359
ISSUE DATE / TIME: 202201011417
Unit Type and Rh: 5100

## 2020-10-03 LAB — CBC
HCT: 21.9 % — ABNORMAL LOW (ref 39.0–52.0)
Hemoglobin: 7 g/dL — ABNORMAL LOW (ref 13.0–17.0)
MCH: 25.4 pg — ABNORMAL LOW (ref 26.0–34.0)
MCHC: 32 g/dL (ref 30.0–36.0)
MCV: 79.3 fL — ABNORMAL LOW (ref 80.0–100.0)
Platelets: 108 10*3/uL — ABNORMAL LOW (ref 150–400)
RBC: 2.76 MIL/uL — ABNORMAL LOW (ref 4.22–5.81)
RDW: 19.1 % — ABNORMAL HIGH (ref 11.5–15.5)
WBC: 8.2 10*3/uL (ref 4.0–10.5)
nRBC: 0 % (ref 0.0–0.2)

## 2020-10-03 LAB — MAGNESIUM: Magnesium: 2.4 mg/dL (ref 1.7–2.4)

## 2020-10-03 LAB — HEMOGLOBIN AND HEMATOCRIT, BLOOD
HCT: 21.6 % — ABNORMAL LOW (ref 39.0–52.0)
Hemoglobin: 7.2 g/dL — ABNORMAL LOW (ref 13.0–17.0)

## 2020-10-03 MED ORDER — OXYCODONE HCL 5 MG PO TABS
5.0000 mg | ORAL_TABLET | ORAL | Status: DC | PRN
  Administered 2020-10-04 – 2020-10-06 (×3): 10 mg via ORAL
  Administered 2020-10-08: 5 mg via ORAL
  Filled 2020-10-03: qty 2
  Filled 2020-10-03: qty 1
  Filled 2020-10-03 (×2): qty 2

## 2020-10-03 MED ORDER — POLYETHYLENE GLYCOL 3350 17 G PO PACK
17.0000 g | PACK | Freq: Every day | ORAL | Status: DC
  Administered 2020-10-03: 17 g via ORAL
  Filled 2020-10-03 (×4): qty 1

## 2020-10-03 MED ORDER — TRAMADOL HCL 50 MG PO TABS
50.0000 mg | ORAL_TABLET | Freq: Four times a day (QID) | ORAL | Status: DC | PRN
  Administered 2020-10-04 – 2020-10-07 (×4): 50 mg via ORAL
  Filled 2020-10-03 (×5): qty 1

## 2020-10-03 MED ORDER — POTASSIUM CHLORIDE CRYS ER 20 MEQ PO TBCR
40.0000 meq | EXTENDED_RELEASE_TABLET | Freq: Once | ORAL | Status: AC
  Administered 2020-10-03: 40 meq via ORAL
  Filled 2020-10-03: qty 2

## 2020-10-03 MED ORDER — LORAZEPAM 1 MG PO TABS
1.0000 mg | ORAL_TABLET | ORAL | Status: AC | PRN
  Administered 2020-10-03 – 2020-10-05 (×2): 1 mg via ORAL
  Filled 2020-10-03: qty 1
  Filled 2020-10-03: qty 2
  Filled 2020-10-03: qty 1

## 2020-10-03 MED ORDER — SENNOSIDES-DOCUSATE SODIUM 8.6-50 MG PO TABS
1.0000 | ORAL_TABLET | Freq: Every day | ORAL | Status: DC
  Filled 2020-10-03 (×2): qty 1

## 2020-10-03 MED ORDER — BISACODYL 5 MG PO TBEC
5.0000 mg | DELAYED_RELEASE_TABLET | Freq: Every day | ORAL | Status: DC | PRN
  Administered 2020-10-03: 5 mg via ORAL
  Filled 2020-10-03: qty 1

## 2020-10-03 MED ORDER — LORAZEPAM 2 MG/ML IJ SOLN: 1.0000 mg | Freq: Four times a day (QID) | INTRAMUSCULAR | Status: AC | PRN

## 2020-10-03 MED ORDER — MAGNESIUM CITRATE PO SOLN
1.0000 | Freq: Once | ORAL | Status: DC
  Filled 2020-10-03: qty 296

## 2020-10-03 MED ORDER — OXYCODONE HCL 5 MG PO TABS
10.0000 mg | ORAL_TABLET | ORAL | Status: DC | PRN
  Administered 2020-10-03 – 2020-10-05 (×3): 10 mg via ORAL
  Administered 2020-10-05 – 2020-10-07 (×7): 15 mg via ORAL
  Filled 2020-10-03 (×2): qty 3
  Filled 2020-10-03: qty 2
  Filled 2020-10-03 (×7): qty 3
  Filled 2020-10-03: qty 2
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
## 2020-10-03 NOTE — Progress Notes (Signed)
PROGRESS NOTE    Kenneth Mccall  [id number] DOB: [birth year] DOA: 09/30/2020 PCP: System, Provider Not In    Assessment & Plan:   Active Problems:   Hip fracture (HCC)   Pseudocholinesterase deficiency   Kenneth Mccall is a 65 y.o. male with medical history significant of  HTN, aortic dissection/ s/p repair,aortic valve repair all in 2012 now with mechanical heart valve on warfarin but has been noncompliant,history of multiple falls due to chronic debility in setting of ETOH abuse,  chronic back pain, HepC,Horseshoe kidney,who presents to ED BIB EMS s/p mechanical fall at home s/p tripping over a rug with injury to his left hip.    Left proximal femur fracture  S/p INTRAMEDULLARY NAIL on 10/01/20 --pain management --ensure BM - increased bowel regimen today, give suppository --Weight-Bearing as tolerated to left leg --PT eval  ETOH dependence abuse  -with associated lactic acidosis -started on ciwa with standing and prn ativan Plan: --cont CIWA --thiamine  Leukocytosis  - likely reactive in setting of fracture.  No sign or source of infection. --procal can be high as reaction as well  Plan: --stop antibiotics and monitor clinically --f/u blood cx (only 1 obtained)  Hx of Aortic valve replacement /dissection repair 2012 reportedly mechanical valve, supposedly on warfarin, however, non-compliant. -patient inr was subtherapeutic on presentation  --cardiology consulted Plan: --per cards should be resumed on warfarin --will continue to hold off warfarin for now due to anemia requiring transfusion 1/1  HTN -- continue metoprolol  Episode of Chest pain - resolved. Mildly elevated trop due to demand ischemia --trop in 30's flat --CTA chest neg for aneurysm or dissection, neg for PE  Chronic back pain - PRN analgesics as above.  PT evaluation  Anemia, Microcytic, blood loss from surgery --Hgb dropped to 6.4 on 1/1, transfused 1 unit pRBC's --trend Hbg   --transfuse to keep Hgb >=7.0  Horseshoe kidney -cr stable    DVT prophylaxis: SCD/Compression stockings Code Status: Full code  Family Communication: sister updated at bedside today Status is: inpatient Dispo:   The patient is from: home Anticipated d/c is to: likely SNF Anticipated d/c date is: 1-2 days Patient currently is not medically stable to d/c due to: post-op, pending PT eval   Subjective and Interval History:  Pt seen today at bedside.  He reports back pain more so than hip pain right now.  Has chronic back pain exacerbated by being in the bed.  Reports constipation and nausea but no vomiting.   Objective: Vitals:   10/03/20 0110 10/03/20 0428 10/03/20 0747 10/03/20 1134  BP: (!) 143/84 (!) 146/99 137/80 134/85  Pulse: 79 79 79 78  Resp: 19  16 18   Temp: 97.8 F (36.6 C) 97.8 F (36.6 C) 97.9 F (36.6 C) 97.9 F (36.6 C)  TempSrc:      SpO2: 99% 100% 100% 98%  Weight:      Height:        Intake/Output Summary (Last 24 hours) at 10/03/2020 1303 Last data filed at 10/03/2020 [id number] Gross per 24 hour  Intake 1255.16 ml  Output 500 ml  Net 755.16 ml   Filed Weights   10/01/20 0200 10/01/20 2230  Weight: 66.8 kg 73.1 kg    Examination:   Constitutional: awake, alert NAD CV: RRR, normal S1/S2   RESP: CTAB no wheezing or rhonhi, on RA Extremities: no lower extremity edema, moves all Neuro:  Normal speech, grossly non-focal exam   Data Reviewed: I have personally reviewed following labs and  imaging studies  CBC: Recent Labs  Lab 09/30/20 1202 09/30/20 1600 09/30/20 1920 10/01/20 0458 10/01/20 1117 10/02/20 0529 10/02/20 1813 10/03/20 0600  WBC 15.6* 14.0*  --  10.6*  --  11.0*  --  8.2  NEUTROABS 13.1*  --   --   --   --   --   --   --   HGB 11.1* 8.3*   < > 8.7* 8.3* 6.4* 7.4* 7.0*  HCT 34.2* 25.6*  --  26.3*  --  19.5* 22.2* 21.9*  MCV 75.0* 75.5*  --  74.5*  --  75.9*  --  79.3*  PLT 178 129*  --  119*  --  105*  --  108*   < > =  values in this interval not displayed.   Basic Metabolic Panel: Recent Labs  Lab 09/30/20 1202 09/30/20 1600 10/01/20 0458 10/01/20 1033 10/02/20 0529 10/03/20 0600  NA 133* 131* 134* 132* 131* 133*  K 2.6* 3.2* 2.9* 4.9 3.6 3.1*  CL 91* 91* 96* 96* 97* 100  CO2 24 27 31 25 26 28   GLUCOSE 185* 164* 124* 106* 160* 105*  BUN 8 7* 9 10 13 16   CREATININE 0.74 0.79 0.69 0.75 0.61 0.70  CALCIUM 8.9 7.9* 8.5* 8.9 8.3* 7.8*  MG 1.1* 1.5*  --   --  1.3* 2.4  PHOS  --  3.7  --   --   --   --    GFR: Estimated Creatinine Clearance: 93.9 mL/min (by C-G formula based on SCr of 0.7 mg/dL). Liver Function Tests: Recent Labs  Lab 09/30/20 1202 09/30/20 1600  AST 38 38  ALT 14 12  ALKPHOS 76 56  BILITOT 1.2 1.2  PROT 7.2 5.2*  ALBUMIN 3.8 2.8*   No results for input(s): LIPASE, AMYLASE in the last 168 hours. No results for input(s): AMMONIA in the last 168 hours. Coagulation Profile: Recent Labs  Lab 09/30/20 1202  INR 1.0   Cardiac Enzymes: No results for input(s): CKTOTAL, CKMB, CKMBINDEX, TROPONINI in the last 168 hours. BNP (last 3 results) No results for input(s): PROBNP in the last 8760 hours. HbA1C: No results for input(s): HGBA1C in the last 72 hours. CBG: No results for input(s): GLUCAP in the last 168 hours. Lipid Profile: No results for input(s): CHOL, HDL, LDLCALC, TRIG, CHOLHDL, LDLDIRECT in the last 72 hours. Thyroid Function Tests: No results for input(s): TSH, T4TOTAL, FREET4, T3FREE, THYROIDAB in the last 72 hours. Anemia Panel: Recent Labs    09/30/20 1600  TIBC 347  IRON 36*   Sepsis Labs: Recent Labs  Lab 09/30/20 1236 09/30/20 1759 09/30/20 1938 09/30/20 2238 10/01/20 0458 10/02/20 0529  PROCALCITON  --  <0.10  --   --  3.01 3.35  LATICACIDVEN 6.2* 3.2* 4.0* 4.1*  --   --     Recent Results (from the past 240 hour(s))  SARS CORONAVIRUS 2 (TAT 6-24 HRS) Nasopharyngeal Nasopharyngeal Swab     Status: None   Collection Time: 09/30/20  12:02 PM   Specimen: Nasopharyngeal Swab  Result Value Ref Range Status   SARS Coronavirus 2 NEGATIVE NEGATIVE Final    Comment: (NOTE) SARS-CoV-2 target nucleic acids are NOT DETECTED.  The SARS-CoV-2 RNA is generally detectable in upper and lower respiratory specimens during the acute phase of infection. Negative results do not preclude SARS-CoV-2 infection, do not rule out co-infections with other pathogens, and should not be used as the sole basis for treatment or other patient management decisions. Negative  results must be combined with clinical observations, patient history, and epidemiological information. The expected result is Negative.  Fact Sheet for Patients: SugarRoll.be  Fact Sheet for Healthcare Providers: https://www.woods-mathews.com/  This test is not yet approved or cleared by the [place] FDA and  has been authorized for detection and/or diagnosis of SARS-CoV-2 by FDA under an Emergency Use Authorization (EUA). This EUA will remain  in effect (meaning this test can be used) for the duration of the COVID-19 declaration under Se ction 564(b)(1) of the Act, 21 U.S.C. section 360bbb-3(b)(1), unless the authorization is terminated or revoked sooner.  Performed at [facility], [place] [address], [place], [place] [address]   Culture, blood (single)     Status: None (Preliminary result)   Collection Time: 09/30/20  2:06 PM   Specimen: BLOOD  Result Value Ref Range Status   Specimen Description BLOOD RIGHT ANTECUBITAL  Final   Special Requests   Final    BOTTLES DRAWN AEROBIC AND ANAEROBIC Blood Culture adequate volume   Culture   Final    NO GROWTH 3 DAYS Performed at [facility], [address] [place], [place] [address]    Report Status PENDING  Incomplete      Radiology Studies: DG HIP OPERATIVE UNILAT W OR W/O PELVIS LEFT  Result Date: 10/01/2020 CLINICAL DATA:  Intramedullary rod  placement EXAM: OPERATIVE left HIP (WITH PELVIS IF PERFORMED) 2 VIEWS TECHNIQUE: Fluoroscopic spot image(s) were submitted for interpretation post-operatively. COMPARISON:  09/30/2020 FINDINGS: 8 fluoroscopic images are obtained during the performance of the procedure and are provided for interpretation only. Intramedullary rod with proximal dynamic screws and distal interlocking screw are seen traversing a comminuted intertrochanteric left hip fracture. Alignment is near anatomic. Please refer to the operative report. FLUOROSCOPY TIME:  2 minutes 41 seconds IMPRESSION: 1. ORIF of an intertrochanteric left hip fracture with near anatomic alignment. Electronically Signed   By: Randa Ngo M.D.   On: 10/01/2020 16:08     Scheduled Meds: . sodium chloride   Intravenous Once  . acetaminophen  1,000 mg Oral Q8H  . docusate sodium  100 mg Oral BID  . enoxaparin (LOVENOX) injection  40 mg Subcutaneous Q24H  . feeding supplement  237 mL Oral TID BM  . folic acid  1 mg Oral Daily  . metoprolol succinate  100 mg Oral Daily  . polyethylene glycol  17 g Oral Daily  . senna-docusate  1 tablet Oral QHS  . thiamine  100 mg Oral Daily   Or  . thiamine  100 mg Intravenous Daily   Continuous Infusions: . sodium chloride 75 mL/hr at 10/03/20 0316  . methocarbamol (ROBAXIN) IV       LOS: 3 days    Time spent: 25 minutes with > 50% spent in coordination of care and at bedside    Ezekiel Slocumb, MD [facility] If 7PM-7AM, please contact night-coverage 10/03/2020, 1:03 PM

## 2020-10-03 NOTE — Plan of Care (Signed)
  Problem: Education: Goal: Knowledge of General Education information will improve Description: Including pain rating scale, medication(s)/side effects and non-pharmacologic comfort measures 10/03/2020 1158 by Ansel Bong, RN Outcome: Progressing 10/03/2020 1158 by Ansel Bong, RN Outcome: Progressing   Problem: Health Behavior/Discharge Planning: Goal: Ability to manage health-related needs will improve 10/03/2020 1158 by Ansel Bong, RN Outcome: Progressing 10/03/2020 1158 by Ansel Bong, RN Outcome: Progressing   Problem: Clinical Measurements: Goal: Ability to maintain clinical measurements within normal limits will improve 10/03/2020 1158 by Ansel Bong, RN Outcome: Progressing 10/03/2020 1158 by Ansel Bong, RN Outcome: Progressing Goal: Will remain free from infection 10/03/2020 1158 by Ansel Bong, RN Outcome: Progressing 10/03/2020 1158 by Ansel Bong, RN Outcome: Progressing Goal: Diagnostic test results will improve 10/03/2020 1158 by Ansel Bong, RN Outcome: Progressing 10/03/2020 1158 by Ansel Bong, RN Outcome: Progressing Goal: Respiratory complications will improve 10/03/2020 1158 by Ansel Bong, RN Outcome: Progressing 10/03/2020 1158 by Ansel Bong, RN Outcome: Progressing Goal: Cardiovascular complication will be avoided 10/03/2020 1158 by Ansel Bong, RN Outcome: Progressing 10/03/2020 1158 by Ansel Bong, RN Outcome: Progressing   Problem: Activity: Goal: Risk for activity intolerance will decrease 10/03/2020 1158 by Ansel Bong, RN Outcome: Progressing 10/03/2020 1158 by Ansel Bong, RN Outcome: Progressing   Problem: Nutrition: Goal: Adequate nutrition will be maintained 10/03/2020 1158 by Ansel Bong, RN Outcome: Progressing 10/03/2020 1158 by Ansel Bong, RN Outcome: Progressing   Problem: Coping: Goal: Level of anxiety will decrease 10/03/2020 1158 by Ansel Bong, RN Outcome: Progressing 10/03/2020 1158 by  Ansel Bong, RN Outcome: Progressing   Problem: Elimination: Goal: Will not experience complications related to bowel motility 10/03/2020 1158 by Ansel Bong, RN Outcome: Progressing 10/03/2020 1158 by Ansel Bong, RN Outcome: Progressing Goal: Will not experience complications related to urinary retention 10/03/2020 1158 by Ansel Bong, RN Outcome: Progressing 10/03/2020 1158 by Ansel Bong, RN Outcome: Progressing   Problem: Pain Managment: Goal: General experience of comfort will improve 10/03/2020 1158 by Ansel Bong, RN Outcome: Progressing 10/03/2020 1158 by Ansel Bong, RN Outcome: Progressing   Problem: Safety: Goal: Ability to remain free from injury will improve 10/03/2020 1158 by Ansel Bong, RN Outcome: Progressing 10/03/2020 1158 by Ansel Bong, RN Outcome: Progressing   Problem: Skin Integrity: Goal: Risk for impaired skin integrity will decrease 10/03/2020 1158 by Ansel Bong, RN Outcome: Progressing 10/03/2020 1158 by Ansel Bong, RN Outcome: Progressing

## 2020-10-03 NOTE — Evaluation (Signed)
Occupational Therapy Evaluation Patient Details Name: Kenneth Mccall MRN: [id number] DOB: 31-May-1956 Today's Date: 10/03/2020    History of Present Illness Kenneth Mccall is a 65 y.o. male with medical history significant of HTN, aortic dissection/ s/p repair,aortic valve repair all in 2012 now with mechanical heart valve on warfarin but has been noncompliant,history of multiple falls due to chronic debility in setting of ETOH abuse,  chronic back pain, HepC,Horseshoe kidney,who presents to ED  s/p mechanical fall at home. S/p L hp NTRAMEDULLARY NAIL on 10/01/20   Clinical Impression   Kenneth Mccall was seen for OT evaluation this date. Prior to hospital admission, pt was MOD I using 4WW and requiring assist for IADLS (1x/week PCA and family PRN). Pt lives alone, assistance PRN, in mobile home c ramped entrance. Pt presents to acute OT demonstrating impaired ADL performance and functional mobility 2/2 decreased activity tolerance, functional strength/ROM/balance deficits, and poor insight into deficits. 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  Pt currently requires MOD A for BSC t/f - L knee block 2/2 buckling. MOD A x2 for perihygiene in standing. MAX A for LBD at bed level and seated on commode. Pt endorses drowsiness as pain medication and requests return to bed following BM attempt (passed gas only). Pt would benefit from skilled OT to address noted impairments and functional limitations (see below for any additional details) in order to maximize safety and independence while minimizing falls risk and caregiver burden. Upon hospital discharge, recommend STR to maximize pt safety and return to PLOF.     Follow Up Recommendations  SNF    Equipment Recommendations  Other (comment) (TBD)    Recommendations for Other Services       Precautions / Restrictions Precautions Precautions: Fall Restrictions Weight Bearing Restrictions:  Yes LLE Weight Bearing: Weight bearing as tolerated      Mobility Bed Mobility Overal bed mobility: Needs Assistance Bed Mobility: Supine to Sit;Sit to Supine     Supine to sit: Mod assist;HOB elevated Sit to supine: Mod assist;+2 for physical assistance        Transfers Overall transfer level: Needs assistance   Transfers: Stand Pivot Transfers   Stand pivot transfers: Mod assist       General transfer comment: LLE block 2/2 buckling    Balance Overall balance assessment: Needs assistance Sitting-balance support: Bilateral upper extremity supported;Feet supported Sitting balance-Leahy Scale: Fair     Standing balance support: Bilateral upper extremity supported;During functional activity Standing balance-Leahy Scale: Poor                             ADL either performed or assessed with clinical judgement   ADL Overall ADL's : Needs assistance/impaired                                       General ADL Comments: MOD A for BSC t/f. MOD A x2 for perihygiene in standing. MAX A for LBD at bed level and seated on commode.                  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 Limited finger flexion/extension, trace shoulder movement   Lower Extremity Assessment Lower Extremity Assessment: LLE deficits/detail LLE Deficits / Details: 3-/5 grossly. Limited by pain       Communication Communication Communication: No difficulties   Cognition  Arousal/Alertness: Awake/alert Behavior During Therapy: WFL for tasks assessed/performed Overall Cognitive Status: Within Functional Limits for tasks assessed                                        Exercises Exercises: Other exercises;General Lower Extremity General Exercises - Lower Extremity Quad Sets: AROM;Left;5 reps;Supine Gluteal Sets: AROM;Left;5 reps;Supine Heel Slides: AROM;Left;5 reps;Supine Other Exercises Other Exercises: Pt educated re: OT role, DME recs, d/c recs, fa;;s prevention, ECS, pain mgmt Other Exercises: LBD, toileting, sup<>sit, sit<>stand, SPT, sitting/standing balance/tolerance   Shoulder Instructions      Home Living Family/patient expects to be discharged to:: Private residence Living Arrangements: Alone Available Help at Discharge: Personal care attendant;Available PRN/intermittently;Family Type of Home: Mobile home Home Access: Ramped entrance     Home Layout: One level     Bathroom Shower/Tub: Walk-in shower         Home Equipment: Environmental consultant - 4 wheels;Shower seat          Prior Functioning/Environment Level of Independence: Independent with assistive device(s)        Comments: MOD I using 4WW for ADLs and mobility. PCA 1-2x/week for IADLs        OT Problem List: Decreased strength;Decreased range of motion;Decreased activity tolerance;Impaired balance (sitting and/or standing);Decreased safety awareness;Pain      OT Treatment/Interventions: Self-care/ADL training;Therapeutic exercise;DME and/or AE instruction;Therapeutic activities;Patient/family education;Balance training    OT Goals(Current goals can be found in the care plan section) Acute Rehab OT Goals Patient Stated Goal: To return home safely OT Goal Formulation: With patient Time For Goal Achievement: 10/17/20 Potential to Achieve Goals: Good ADL Goals Pt Will Perform Grooming: with modified independence;sitting Pt Will Perform Lower Body Dressing: with  min assist;sitting/lateral leans Pt Will Transfer to Toilet: with min assist;bedside commode;ambulating (c LRAD PRN)  OT Frequency: Min 2X/week   Barriers to D/C: Decreased caregiver support             AM-PAC OT "6 Clicks" Daily Activity     Outcome Measure Help from another person eating meals?: None Help from another person taking care of personal grooming?: A Lot Help from another person toileting, which includes using toliet, bedpan, or urinal?: A Lot Help from another person bathing (including washing, rinsing, drying)?: A Lot Help from another person to put on and taking off regular upper body clothing?: A Little Help from another person to put on and taking off regular lower body clothing?: A Lot 6 Click Score: 15   End of Session Nurse Communication: Mobility status  Activity Tolerance: Patient tolerated treatment well;Patient limited by pain Patient left: in bed;with call bell/phone within reach;with bed alarm set  OT Visit Diagnosis: Other abnormalities of gait and mobility (R26.89)                Time: 1027-1101 OT Time Calculation (min): 34 min Charges:  OT General Charges $OT Visit: 1 Visit OT Evaluation $OT Eval Moderate Complexity: 1 Mod OT Treatments $Self Care/Home Management : 23-37 mins  Gulianna Hornsby  Loura Back, M.S. OTR/L  10/03/20, 1:48 PM  ascom [phone]

## 2020-10-03 NOTE — Progress Notes (Signed)
PT Cancellation Note  Patient Details Name: Kenneth Mccall MRN: [id number] DOB: [birth year]   Cancelled Treatment:    Reason Eval/Treat Not Completed: Other (comment) (patient on bedpan after being given medication to induce BM. Patient states he feels he will have uncontrolled BM if he attempts to move and declines PT at this time.)   Luretha Murphy. Ilsa Iha, PT, DPT 10/03/20, 2:33 PM

## 2020-10-04 ENCOUNTER — Encounter: Payer: Self-pay | Admitting: Orthopedic Surgery

## 2020-10-04 DIAGNOSIS — S72002A Fracture of unspecified part of neck of left femur, initial encounter for closed fracture: Secondary | ICD-10-CM | POA: Diagnosis not present

## 2020-10-04 LAB — BASIC METABOLIC PANEL
Anion gap: 6 (ref 5–15)
BUN: 15 mg/dL (ref 8–23)
CO2: 30 mmol/L (ref 22–32)
Calcium: 8 mg/dL — ABNORMAL LOW (ref 8.9–10.3)
Chloride: 99 mmol/L (ref 98–111)
Creatinine, Ser: 0.57 mg/dL — ABNORMAL LOW (ref 0.61–1.24)
GFR, Estimated: >60 mL/min (ref >60)
Glucose, Bld: 114 mg/dL — ABNORMAL HIGH (ref 70–99)
Potassium: 3.3 mmol/L — ABNORMAL LOW (ref 3.5–5.1)
Sodium: 135 mmol/L (ref 135–145)

## 2020-10-04 LAB — PHOSPHORUS: Phosphorus: 1.6 mg/dL — ABNORMAL LOW (ref 2.5–4.6)

## 2020-10-04 LAB — CBC
HCT: 26.2 % — ABNORMAL LOW (ref 39.0–52.0)
Hemoglobin: 8.2 g/dL — ABNORMAL LOW (ref 13.0–17.0)
MCH: 25.5 pg — ABNORMAL LOW (ref 26.0–34.0)
MCHC: 31.3 g/dL (ref 30.0–36.0)
MCV: 81.4 fL (ref 80.0–100.0)
Platelets: 152 10*3/uL (ref 150–400)
RBC: 3.22 MIL/uL — ABNORMAL LOW (ref 4.22–5.81)
RDW: 19.6 % — ABNORMAL HIGH (ref 11.5–15.5)
WBC: 8 10*3/uL (ref 4.0–10.5)
nRBC: 0 % (ref 0.0–0.2)

## 2020-10-04 LAB — PROTIME-INR
INR: 1 (ref 0.8–1.2)
Prothrombin Time: 12.5 seconds (ref 11.4–15.2)

## 2020-10-04 LAB — MAGNESIUM: Magnesium: 1.9 mg/dL (ref 1.7–2.4)

## 2020-10-04 MED ORDER — K PHOS MONO-SOD PHOS DI & MONO 155-852-130 MG PO TABS
500.0000 mg | ORAL_TABLET | ORAL | Status: AC
  Administered 2020-10-04 (×3): 500 mg via ORAL
  Filled 2020-10-04 (×3): qty 2

## 2020-10-04 MED ORDER — WARFARIN - PHARMACIST DOSING INPATIENT: Freq: Every day | Status: DC

## 2020-10-04 MED ORDER — WARFARIN SODIUM 6 MG PO TABS
6.0000 mg | ORAL_TABLET | Freq: Once | ORAL | Status: AC
  Administered 2020-10-04: 6 mg via ORAL
  Filled 2020-10-04: qty 1

## 2020-10-04 MED ORDER — WARFARIN SODIUM 1 MG PO TABS
1.5000 mg | ORAL_TABLET | Freq: Once | ORAL | Status: AC
  Administered 2020-10-04: 1.5 mg via ORAL
  Filled 2020-10-04: qty 1

## 2020-10-04 MED ORDER — LOSARTAN POTASSIUM 50 MG PO TABS
50.0000 mg | ORAL_TABLET | Freq: Every day | ORAL | Status: DC
  Administered 2020-10-05 – 2020-10-08 (×4): 50 mg via ORAL
  Filled 2020-10-04 (×4): qty 1
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
## 2020-10-04 NOTE — Anesthesia Postprocedure Evaluation (Signed)
Anesthesia Post Note  Patient: Kenneth Mccall  Procedure(s) Performed: INTRAMEDULLARY (IM) NAIL INTERTROCHANTRIC (Left Hip)  Patient location during evaluation: PACU Anesthesia Type: General Level of consciousness: awake and alert Pain management: pain level controlled Vital Signs Assessment: post-procedure vital signs reviewed and stable Respiratory status: spontaneous breathing, nonlabored ventilation and respiratory function stable Cardiovascular status: blood pressure returned to baseline and stable Postop Assessment: no apparent nausea or vomiting Anesthetic complications: no   No complications documented.   Last Vitals:  Vitals:   10/03/20 2349 10/04/20 0321  BP: (!) 152/89 (!) 149/96  Pulse: 79 80  Resp: 18 19  Temp: 36.9 C 36.6 C  SpO2: 99% 97%    Last Pain:  Vitals:   10/04/20 0600  TempSrc:   PainSc: 7                  Aurelio Brash Ramsdell

## 2020-10-04 NOTE — Progress Notes (Addendum)
PROGRESS NOTE    Kenneth Mccall  WEX:[id number] DOB: 1956-09-05 DOA: 09/30/2020 PCP: System, Provider Not In    Assessment & Plan:   Active Problems:   Hip fracture (HCC)   Pseudocholinesterase deficiency   Kenneth Mccall is a 65 y.o. male with medical history significant of  HTN, aortic dissection/ s/p repair,aortic valve repair all in 2012 now with mechanical heart valve on warfarin but has been noncompliant,history of multiple falls due to chronic debility in setting of ETOH abuse,  chronic back pain, HepC,Horseshoe kidney,who presents to ED BIB EMS s/p mechanical fall at home s/p tripping over a rug with injury to his left hip.    Left proximal femur fracture  S/p INTRAMEDULLARY NAIL on 10/01/20 --pain management --Continue bowel regimen, may hold if having frequent or loose stool --Weight-Bearing as tolerated to left leg --PT recommends SNF for short-term rehab, TOC working on placement  Electrolyte abnormalities: Hypokalemia, hypophosphatemia --Given K-Phos for replacement today. --Daily BMP, magnesium and phosphorus levels to monitor  ETOH dependence abuse  -with associated lactic acidosis Plan: --cont CIWA with as needed Ativan --thiamine  Leukocytosis  - likely reactive in setting of fracture.  No sign or source of infection. --procal can be high as reaction as well  Plan: --stop antibiotics and monitor clinically --f/u blood cx (only 1 obtained)  Hx of Aortic valve replacement /dissection repair 2012 reportedly mechanical valve, supposedly on warfarin, however, non-compliant. -patient inr was subtherapeutic on presentation  --cardiology consulted Plan: --per cards should be resumed on warfarin --Resume warfarin, dosing per pharmacy  HTN -- continue metoprolol, resume home losartan  Episode of Chest pain - resolved. Mildly elevated trop due to demand ischemia --trop in 30's flat --CTA chest neg for aneurysm or dissection, neg for PE  Chronic back  pain - PRN analgesics as above.  PT recommending SNF for rehab.  Anemia, Microcytic, blood loss from surgery --Hgb dropped to 6.4 on 1/1, transfused 1 unit pRBC's --trend Hbg  --transfuse to keep Hgb >=7.0 1/3: Stable and improving with hemoglobin 8.2  Horseshoe kidney -cr stable    DVT prophylaxis: SCD/Compression stockings Code Status: Full code  Family Communication: sister updated at bedside today Status is: inpatient Dispo:   The patient is from: home Anticipated d/c is to: likely SNF Anticipated d/c date is: 1-2 days pending SNF placement Patient currently is medically stable to d/c.   Subjective and Interval History:  Pt seen today at bedside today, using bedside commode.  He reports having significant hip pain when attempting to work with PT short while ago.  Currently rates pain 7-8 out of 10.  Also reports some nausea.  Did have BM last night after mag citrate and currently with loose stool.   Objective: Vitals:   10/03/20 2349 10/04/20 0321 10/04/20 0805 10/04/20 1206  BP: (!) 152/89 (!) 149/96 (!) 145/75 (!) 141/89  Pulse: 79 80 79 82  Resp: 18 19 18 16   Temp: 98.5 F (36.9 C) 97.8 F (36.6 C) (!) 97.5 F (36.4 C) 98.2 F (36.8 C)  TempSrc:      SpO2: 99% 97% 97% 98%  Weight:      Height:        Intake/Output Summary (Last 24 hours) at 10/04/2020 1424 Last data filed at 10/04/2020 0400 Gross per 24 hour  Intake --  Output 900 ml  Net -900 ml   Filed Weights   10/01/20 0200 10/01/20 2230  Weight: 66.8 kg 73.1 kg    Examination:   Constitutional:  awake, alert, NAD, on commode CV: RRR, normal S1/S2   RESP: CTAB, on room air, normal respiratory effort Extremities: no lower extremity edema, moves all GI: Soft, nontender, no distention   Data Reviewed: I have personally reviewed following labs and imaging studies  CBC: Recent Labs  Lab 09/30/20 1202 09/30/20 1600 09/30/20 1920 10/01/20 0458 10/01/20 1117 10/02/20 0529 10/02/20 1813  10/03/20 0600 10/03/20 1321 10/04/20 0547  WBC 15.6* 14.0*  --  10.6*  --  11.0*  --  8.2  --  8.0  NEUTROABS 13.1*  --   --   --   --   --   --   --   --   --   HGB 11.1* 8.3*   < > 8.7*   < > 6.4* 7.4* 7.0* 7.2* 8.2*  HCT 34.2* 25.6*  --  26.3*  --  19.5* 22.2* 21.9* 21.6* 26.2*  MCV 75.0* 75.5*  --  74.5*  --  75.9*  --  79.3*  --  81.4  PLT 178 129*  --  119*  --  105*  --  108*  --  152   < > = values in this interval not displayed.   Basic Metabolic Panel: Recent Labs  Lab 09/30/20 1202 09/30/20 1600 10/01/20 0458 10/01/20 1033 10/02/20 0529 10/03/20 0600 10/04/20 0547  NA 133* 131* 134* 132* 131* 133* 135  K 2.6* 3.2* 2.9* 4.9 3.6 3.1* 3.3*  CL 91* 91* 96* 96* 97* 100 99  CO2 24 27 31 25 26 28 30   GLUCOSE 185* 164* 124* 106* 160* 105* 114*  BUN 8 7* 9 10 13 16 15   CREATININE 0.74 0.79 0.69 0.75 0.61 0.70 0.57*  CALCIUM 8.9 7.9* 8.5* 8.9 8.3* 7.8* 8.0*  MG 1.1* 1.5*  --   --  1.3* 2.4 1.9  PHOS  --  3.7  --   --   --   --  1.6*   GFR: Estimated Creatinine Clearance: 93.9 mL/min (A) (by C-G formula based on SCr of 0.57 mg/dL (L)). Liver Function Tests: Recent Labs  Lab 09/30/20 1202 09/30/20 1600  AST 38 38  ALT 14 12  ALKPHOS 76 56  BILITOT 1.2 1.2  PROT 7.2 5.2*  ALBUMIN 3.8 2.8*   No results for input(s): LIPASE, AMYLASE in the last 168 hours. No results for input(s): AMMONIA in the last 168 hours. Coagulation Profile: Recent Labs  Lab 09/30/20 1202  INR 1.0   Cardiac Enzymes: No results for input(s): CKTOTAL, CKMB, CKMBINDEX, TROPONINI in the last 168 hours. BNP (last 3 results) No results for input(s): PROBNP in the last 8760 hours. HbA1C: No results for input(s): HGBA1C in the last 72 hours. CBG: No results for input(s): GLUCAP in the last 168 hours. Lipid Profile: No results for input(s): CHOL, HDL, LDLCALC, TRIG, CHOLHDL, LDLDIRECT in the last 72 hours. Thyroid Function Tests: No results for input(s): TSH, T4TOTAL, FREET4, T3FREE,  THYROIDAB in the last 72 hours. Anemia Panel: No results for input(s): VITAMINB12, FOLATE, FERRITIN, TIBC, IRON, RETICCTPCT in the last 72 hours. Sepsis Labs: Recent Labs  Lab 09/30/20 1236 09/30/20 1759 09/30/20 1938 09/30/20 2238 10/01/20 0458 10/02/20 0529  PROCALCITON  --  <0.10  --   --  3.01 3.35  LATICACIDVEN 6.2* 3.2* 4.0* 4.1*  --   --     Recent Results (from the past 240 hour(s))  SARS CORONAVIRUS 2 (TAT 6-24 HRS) Nasopharyngeal Nasopharyngeal Swab     Status: None   Collection Time:  09/30/20 12:02 PM   Specimen: Nasopharyngeal Swab  Result Value Ref Range Status   SARS Coronavirus 2 NEGATIVE NEGATIVE Final    Comment: (NOTE) SARS-CoV-2 target nucleic acids are NOT DETECTED.  The SARS-CoV-2 RNA is generally detectable in upper and lower respiratory specimens during the acute phase of infection. Negative results do not preclude SARS-CoV-2 infection, do not rule out co-infections with other pathogens, and should not be used as the sole basis for treatment or other patient management decisions. Negative results must be combined with clinical observations, patient history, and epidemiological information. The expected result is Negative.  Fact Sheet for Patients: SugarRoll.be  Fact Sheet for Healthcare Providers: https://www.woods-mathews.com/  This test is not yet approved or cleared by the [place] FDA and  has been authorized for detection and/or diagnosis of SARS-CoV-2 by FDA under an Emergency Use Authorization (EUA). This EUA will remain  in effect (meaning this test can be used) for the duration of the COVID-19 declaration under Se ction 564(b)(1) of the Act, 21 U.S.C. section 360bbb-3(b)(1), unless the authorization is terminated or revoked sooner.  Performed at [facility], [place] [address] [place], [place] [address]   Culture, blood (single)     Status: None (Preliminary result)   Collection Time:  09/30/20  2:06 PM   Specimen: BLOOD  Result Value Ref Range Status   Specimen Description BLOOD RIGHT ANTECUBITAL  Final   Special Requests   Final    BOTTLES DRAWN AEROBIC AND ANAEROBIC Blood Culture adequate volume   Culture   Final    NO GROWTH 4 DAYS Performed at [facility], [address], [place], [place] [address]    Report Status PENDING  Incomplete      Radiology Studies: No results found.   Scheduled Meds: . sodium chloride   Intravenous Once  . acetaminophen  1,000 mg Oral Q8H  . docusate sodium  100 mg Oral BID  . enoxaparin (LOVENOX) injection  40 mg Subcutaneous Q24H  . feeding supplement  237 mL Oral TID BM  . folic acid  1 mg Oral Daily  . magnesium citrate  1 Bottle Oral Once  . metoprolol succinate  100 mg Oral Daily  . phosphorus  500 mg Oral Q4H  . polyethylene glycol  17 g Oral Daily  . senna-docusate  1 tablet Oral QHS  . thiamine  100 mg Oral Daily   Or  . thiamine  100 mg Intravenous Daily   Continuous Infusions: . sodium chloride 75 mL/hr at 10/03/20 0316  . methocarbamol (ROBAXIN) IV       LOS: 4 days    Time spent: 25 minutes with > 50% spent in coordination of care and at bedside    Ezekiel Slocumb, MD [facility] If 7PM-7AM, please contact night-coverage 10/04/2020, 2:24 PM
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
## 2020-10-04 NOTE — TOC Progression Note (Signed)
Transition of Care [facility]) - Progression Note    Patient Details  Name: Kenneth Mccall MRN: [id number] Date of Birth: 1956/01/11  Transition of Care [facility]) CM/SW [place], RN Phone Number: 10/04/2020, 11:25 AM  Clinical Narrative:   RNCM met with patient in room to discuss new recommendation for SNF at discharge. Patient sitting up in recliner reports to not feeling as well this morning. RNCM discussed with patient that therapists are recommending SNF for which he is agreeable. Patient does not have preference at this time and is agreeable to a search of all local facilities.  RNCM verified PASSR, completed FL2, and started bed search.     Expected Discharge Plan: Home/Self Care Barriers to Discharge: Continued Medical Work up  Expected Discharge Plan and Services Expected Discharge Plan: Home/Self Care   Discharge Planning Services: NA Post Acute Care Choice: [place] arrangements for the past 2 months: Apartment                 DME Arranged: N/A DME Agency: NA       HH Arranged: NA HH Agency: NA         Social Determinants of Health (SDOH) Interventions    Readmission Risk Interventions No flowsheet data found.

## 2020-10-04 NOTE — Care Management Important Message (Signed)
Important Message  Patient Details  Name: Kenneth Mccall MRN: [id number] Date of Birth: [birth year]   Medicare Important Message Given:  Yes     Olegario Messier A Jakory Matsuo 10/04/2020, 11:02 AM

## 2020-10-04 NOTE — NC FL2 (Signed)
[place] MEDICAID FL2 LEVEL OF CARE SCREENING TOOL     IDENTIFICATION  Patient Name: Kenneth Mccall Birthdate: 02/10/1956 Sex: male Admission Date (Current Location): 09/30/2020  [place] and [place] Number:  Chiropodist and Address:  [facility], [address], [place], [place] [address]      Provider Number: [id number]  Attending Physician Name and Address:  Pennie Banter, DO  Relative Name and Phone Number:  Donavan Burnet [phone]    Current Level of Care: Hospital Recommended Level of Care: Skilled Nursing Facility Prior Approval Number:    Date Approved/Denied:   PASRR Number: [id number]  Discharge Plan: SNF    Current Diagnoses: Patient Active Problem List   Diagnosis Date Noted  . 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 Hip fracture (HCC) 09/30/2020    Orientation RESPIRATION BLADDER Height & Weight     Self,Time,Situation,Place  Normal External catheter Weight: 73.1 kg Height:  5\' 11"  (180.3 cm)  BEHAVIORAL SYMPTOMS/MOOD NEUROLOGICAL BOWEL NUTRITION STATUS      Continent Diet (Regular)  AMBULATORY STATUS COMMUNICATION OF NEEDS Skin   Extensive Assist Verbally Surgical wounds                       Personal Care Assistance Level of Assistance  Bathing,Feeding,Dressing Bathing Assistance: Maximum assistance Feeding assistance: Limited assistance Dressing Assistance: Maximum assistance     Functional Limitations Info             SPECIAL CARE FACTORS FREQUENCY  PT (By licensed PT),OT (By licensed OT)                    Contractures Contractures Info: Not present    Additional Factors Info  Code Status,Allergies Code Status Info: Full Allergies Info: Anectine           Current Medications (10/04/2020):  This is the current hospital active medication list Current Facility-Administered Medications  Medication Dose Route Frequency Provider Last Rate Last Admin  . 0.9 %  sodium  chloride infusion (Manually program via Guardrails IV Fluids)   Intravenous Once 12/02/2020, PA-C      . 0.9 %  sodium chloride infusion   Intravenous Continuous Anson [place], MD 75 mL/hr at 10/03/20 0316 Infusion Verify at 10/03/20 0316  . acetaminophen (TYLENOL) tablet 1,000 mg  1,000 mg Oral Q8H 12/01/20, MD   1,000 mg at 10/04/20 0743  . bisacodyl (DULCOLAX) EC tablet 5 mg  5 mg Oral Daily PRN 12/02/20 A, DO   5 mg at 10/03/20 1338  . bisacodyl (DULCOLAX) suppository 10 mg  10 mg Rectal Daily PRN 12/01/20, MD      . docusate sodium (COLACE) capsule 100 mg  100 mg Oral BID Signa Kell, MD   100 mg at 10/03/20 1017  . enoxaparin (LOVENOX) injection 40 mg  40 mg Subcutaneous Q24H 12/01/20, MD   40 mg at 10/04/20 0935  . feeding supplement (ENSURE ENLIVE / ENSURE PLUS) liquid 237 mL  237 mL Oral TID BM 12/02/20, MD   237 mL at 10/04/20 0935  . folic acid (FOLVITE) tablet 1 mg  1 mg Oral Daily 12/02/20, MD   1 mg at 10/04/20 0935  . HYDROmorphone (DILAUDID) injection 0.25-0.5 mg  0.25-0.5 mg Intravenous Q2H PRN 12/02/20, MD      . LORazepam (ATIVAN) tablet 1-4 mg  1-4 mg Oral Q4H PRN Signa Kell A, DO   1 mg at 10/03/20  1825   Or  . LORazepam (ATIVAN) injection 1-4 mg  1-4 mg Intravenous Q6H PRN Nicole Kindred A, DO      . magnesium citrate solution 1 Bottle  1 Bottle Oral Once Ezekiel Slocumb, DO      . methocarbamol (ROBAXIN) tablet 500 mg  500 mg Oral Q6H PRN Leim Fabry, MD   500 mg at 10/04/20 0032   Or  . methocarbamol (ROBAXIN) 500 mg in dextrose 5 % 50 mL IVPB  500 mg Intravenous Q6H PRN Leim Fabry, MD      . metoCLOPramide (REGLAN) tablet 5-10 mg  5-10 mg Oral Q8H PRN Leim Fabry, MD       Or  . metoCLOPramide (REGLAN) injection 5-10 mg  5-10 mg Intravenous Q8H PRN Leim Fabry, MD      . metoprolol succinate (TOPROL-XL) 24 hr tablet 100 mg  100 mg Oral Daily Leim Fabry, MD   100 mg at 10/04/20 0935  . ondansetron (ZOFRAN) tablet 4 mg  4  mg Oral Q6H PRN Leim Fabry, MD       Or  . ondansetron [facility]) injection 4 mg  4 mg Intravenous Q6H PRN Leim Fabry, MD   4 mg at 10/04/20 0426  . oxyCODONE (Oxy IR/ROXICODONE) immediate release tablet 10-15 mg  10-15 mg Oral Q4H PRN Lattie Corns, PA-C   10 mg at 10/03/20 2207  . oxyCODONE (Oxy IR/ROXICODONE) immediate release tablet 5-10 mg  5-10 mg Oral Q4H PRN Lattie Corns, PA-C   10 mg at 10/04/20 [id number]  . polyethylene glycol (MIRALAX / GLYCOLAX) packet 17 g  17 g Oral Daily Nicole Kindred A, DO   17 g at 10/03/20 1030  . senna-docusate (Senokot-S) tablet 1 tablet  1 tablet Oral QHS PRN Leim Fabry, MD      . senna-docusate (Senokot-S) tablet 1 tablet  1 tablet Oral QHS Nicole Kindred A, DO      . sodium phosphate (FLEET) 7-19 GM/118ML enema 1 enema  1 enema Rectal Once PRN Leim Fabry, MD      . thiamine tablet 100 mg  100 mg Oral Daily Leim Fabry, MD   100 mg at 10/04/20 0935   Or  . thiamine (B-1) injection 100 mg  100 mg Intravenous Daily Leim Fabry, MD   100 mg at 10/03/20 1018  . traMADol (ULTRAM) tablet 50 mg  50 mg Oral Q6H PRN Lattie Corns, PA-C         Discharge Medications: Please see discharge summary for a list of discharge medications.  Relevant Imaging Results:  Relevant Lab Results:   Additional Information SS# [id number]  Shelbie Ammons, RN

## 2020-10-05 DIAGNOSIS — S72002A Fracture of unspecified part of neck of left femur, initial encounter for closed fracture: Secondary | ICD-10-CM | POA: Diagnosis not present

## 2020-10-05 LAB — CBC
HCT: 24 % — ABNORMAL LOW (ref 39.0–52.0)
Hemoglobin: 7.8 g/dL — ABNORMAL LOW (ref 13.0–17.0)
MCH: 25.4 pg — ABNORMAL LOW (ref 26.0–34.0)
MCHC: 32.5 g/dL (ref 30.0–36.0)
MCV: 78.2 fL — ABNORMAL LOW (ref 80.0–100.0)
Platelets: 173 10*3/uL (ref 150–400)
RBC: 3.07 MIL/uL — ABNORMAL LOW (ref 4.22–5.81)
RDW: 20.3 % — ABNORMAL HIGH (ref 11.5–15.5)
WBC: 8.6 10*3/uL (ref 4.0–10.5)
nRBC: 0 % (ref 0.0–0.2)

## 2020-10-05 LAB — CULTURE, BLOOD (SINGLE)
Culture: NO GROWTH
Special Requests: ADEQUATE

## 2020-10-05 LAB — MAGNESIUM: Magnesium: 1.5 mg/dL — ABNORMAL LOW (ref 1.7–2.4)

## 2020-10-05 LAB — BASIC METABOLIC PANEL
Anion gap: 7 (ref 5–15)
BUN: 12 mg/dL (ref 8–23)
CO2: 28 mmol/L (ref 22–32)
Calcium: 8 mg/dL — ABNORMAL LOW (ref 8.9–10.3)
Chloride: 99 mmol/L (ref 98–111)
Creatinine, Ser: 0.44 mg/dL — ABNORMAL LOW (ref 0.61–1.24)
GFR, Estimated: >60 mL/min (ref >60)
Glucose, Bld: 130 mg/dL — ABNORMAL HIGH (ref 70–99)
Potassium: 2.8 mmol/L — ABNORMAL LOW (ref 3.5–5.1)
Sodium: 134 mmol/L — ABNORMAL LOW (ref 135–145)

## 2020-10-05 LAB — PROTIME-INR
INR: 1 (ref 0.8–1.2)
Prothrombin Time: 13.1 seconds (ref 11.4–15.2)

## 2020-10-05 LAB — PHOSPHORUS: Phosphorus: 2.5 mg/dL (ref 2.5–4.6)

## 2020-10-05 MED ORDER — WARFARIN SODIUM 7.5 MG PO TABS
7.5000 mg | ORAL_TABLET | Freq: Once | ORAL | Status: AC
  Administered 2020-10-05: 7.5 mg via ORAL
  Filled 2020-10-05: qty 1

## 2020-10-05 MED ORDER — MAGNESIUM SULFATE 2 GM/50ML IV SOLN
2.0000 g | Freq: Once | INTRAVENOUS | Status: AC
  Administered 2020-10-05: 2 g via INTRAVENOUS
  Filled 2020-10-05: qty 50

## 2020-10-05 MED ORDER — OXYCODONE HCL 10 MG PO TABS: 10.0000 mg | ORAL_TABLET | ORAL | 0 refills | Status: DC | PRN

## 2020-10-05 MED ORDER — ENOXAPARIN SODIUM 40 MG/0.4ML ~~LOC~~ SOLN: 40.0000 mg | SUBCUTANEOUS | 0 refills | Status: DC

## 2020-10-05 MED ORDER — LOPERAMIDE HCL 2 MG PO CAPS
2.0000 mg | ORAL_CAPSULE | Freq: Once | ORAL | Status: AC
  Administered 2020-10-05: 2 mg via ORAL
  Filled 2020-10-05: qty 1

## 2020-10-05 MED ORDER — POTASSIUM CHLORIDE CRYS ER 20 MEQ PO TBCR
40.0000 meq | EXTENDED_RELEASE_TABLET | ORAL | Status: AC
  Administered 2020-10-05 (×3): 40 meq via ORAL
  Filled 2020-10-05 (×3): qty 2

## 2020-10-05 MED ORDER — TRAMADOL HCL 50 MG PO TABS: 50.0000 mg | ORAL_TABLET | Freq: Four times a day (QID) | ORAL | 0 refills | Status: DC | PRN

## 2020-10-05 NOTE — Discharge Instructions (Signed)
INSTRUCTIONS AFTER Surgery  o Remove items at home which could result in a fall. This includes throw rugs or furniture in walking pathways o ICE to the affected joint every three hours while awake for 30 minutes at a time, for at least the first 3-5 days, and then as needed for pain and swelling.  Continue to use ice for pain and swelling. You may notice swelling that will progress down to the foot and ankle.  This is normal after surgery.  Elevate your leg when you are not up walking on it.   o Continue to use the breathing machine you got in the hospital (incentive spirometer) which will help keep your temperature down.  It is common for your temperature to cycle up and down following surgery, especially at night when you are not up moving around and exerting yourself.  The breathing machine keeps your lungs expanded and your temperature down.   DIET:  As you were doing prior to hospitalization, we recommend a well-balanced diet.  DRESSING / WOUND CARE / SHOWERING  Dressing change as needed.  No showering.  Staples will be removed in 2 weeks at [facility].    ACTIVITY  o Increase activity slowly as tolerated, but follow the weight bearing instructions below.   o No driving for 6 weeks or until further direction given by your physician.  You cannot drive while taking narcotics.  o No lifting or carrying greater than 10 lbs. until further directed by your surgeon. o Avoid periods of inactivity such as sitting longer than an hour when not asleep. This helps prevent blood clots.  o You may return to work once you are authorized by your doctor.     WEIGHT BEARING  Weight bearing as tolerated   EXERCISES Gait training and ROM exercises.  CONSTIPATION  Constipation is defined medically as fewer than three stools per week and severe constipation as less than one stool per week.  Even if you have a regular bowel pattern at home, your normal regimen is likely to be disrupted due to  multiple reasons following surgery.  Combination of anesthesia, postoperative narcotics, change in appetite and fluid intake all can affect your bowels.   YOU MUST use at least one of the following options; they are listed in order of increasing strength to get the job done.  They are all available over the counter, and you may need to use some, POSSIBLY even all of these options:    Drink plenty of fluids (prune juice may be helpful) and high fiber foods Colace 100 mg by mouth twice a day  Senokot for constipation as directed and as needed Dulcolax (bisacodyl), take with full glass of water  Miralax (polyethylene glycol) once or twice a day as needed.  If you have tried all these things and are unable to have a bowel movement in the first 3-4 days after surgery call either your surgeon or your primary doctor.    If you experience loose stools or diarrhea, hold the medications until you stool forms back up.  If your symptoms do not get better within 1 week or if they get worse, check with your doctor.  If you experience "the worst abdominal pain ever" or develop nausea or vomiting, please contact the office immediately for further recommendations for treatment.   ITCHING:  If you experience itching with your medications, try taking only a single pain pill, or even half a pain pill at a time.  You can also  use Benadryl over the counter for itching or also to help with sleep.   TED HOSE STOCKINGS:  Use stockings on both legs until for at least 2 weeks or as directed by physician office. They may be removed at night for sleeping.  MEDICATIONS:  See your medication summary on the After Visit Summary that nursing will review with you.  You may have some home medications which will be placed on hold until you complete the course of blood thinner medication.  It is important for you to complete the blood thinner medication as prescribed.  PRECAUTIONS:  If you experience chest pain or shortness of  breath - call 911 immediately for transfer to the hospital emergency department.   If you develop a fever greater that 101 F, purulent drainage from wound, increased redness or drainage from wound, foul odor from the wound/dressing, or calf pain - CONTACT YOUR SURGEON.                                                   FOLLOW-UP APPOINTMENTS:  If you do not already have a post-op appointment, please call the office for an appointment to be seen by your surgeon.  Guidelines for how soon to be seen are listed in your After Visit Summary, but are typically between 1-4 weeks after surgery.  OTHER INSTRUCTIONS:     MAKE SURE YOU:   Understand these instructions.   Get help right away if you are not doing well or get worse.    Thank you for letting us be a part of your medical care team.  It is a privilege we respect greatly.  We hope these instructions will help you stay on track for a fast and full recovery!

## 2020-10-05 NOTE — Plan of Care (Signed)
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
## 2020-10-05 NOTE — Progress Notes (Signed)
PT Cancellation Note  Patient Details Name: Kenneth Mccall MRN: [id number] DOB: January 17, 1956   Cancelled Treatment:    Reason Eval/Treat Not Completed: Patient declined to participate with PT services secondary to fatigue.  Pt education provided on physiological benefits of activity but pt continued to decline to participate with therapy, declined offer for bed therex as well.  Will attempt to see pt at a future date/time as medically appropriate.       Ovidio Hanger PT, DPT 10/05/20, 3:55 PM

## 2020-10-05 NOTE — Progress Notes (Addendum)
PROGRESS NOTE    Kenneth Mccall  [id number] DOB: [birth year] DOA: 09/30/2020 PCP: System, Provider Not In    Assessment & Plan:   Active Problems:   Hip fracture (HCC)   Pseudocholinesterase deficiency   Kenneth Mccall is a 65 y.o. male with medical history significant of  HTN, aortic dissection/ s/p repair,aortic valve repair all in 2012 now with mechanical heart valve on warfarin but has been noncompliant,history of multiple falls due to chronic debility in setting of ETOH abuse,  chronic back pain, HepC,Horseshoe kidney,who presents to ED BIB EMS s/p mechanical fall at home s/p tripping over a rug with injury to his left hip.    Left proximal femur fracture  S/p INTRAMEDULLARY NAIL on 10/01/20 --pain management --Continue bowel regimen, may hold if having frequent or loose stool --Weight-Bearing as tolerated to left leg --PT recommends SNF for short-term rehab, TOC working on placement  Electrolyte abnormalities: Hypokalemia, hypomagnesemia hypophosphatemia --above being replaced --Daily BMP, magnesium and phosphorus levels to monitor with further replacement as needed  ETOH dependence abuse  -with associated lactic acidosis Plan: --cont CIWA with as needed Ativan --thiamine  Leukocytosis - likely reactive in setting of fracture.  No sign or source of infection. --procal can be high as reaction as well  --clinically stable since antibiotics stopped Plan: --f/u blood cx - neg to date   Hx of Aortic valve replacement /dissection repair 2012 reportedly mechanical valve, supposed to be on warfarin, but apparently is non-compliant. --INR subtherapeutic on admission --cardiology consulted & recommend warfarin be resumed Plan: --Resumed on warfarin, dosing per pharmacy --Would continue VTE prophylactic Lovenox until INR comes up  HTN -- continue metoprolol, resume home losartan  Episode of Chest pain - resolved. Mildly elevated trop due to demand ischemia --trop  in 30's flat --CTA chest neg for aneurysm or dissection, neg for PE  Chronic back pain - PRN analgesics as above.  PT recommending SNF for rehab, placement underway.  Anemia, Microcytic, blood loss from surgery --Hgb dropped to 6.4 on 1/1, transfused 1 unit pRBC's --trend Hbg and transfuse as needed to keep Hgb >=7.0 1/3-5: Hemoglobin stable and improved post transfusion  Horseshoe kidney -cr stable    DVT prophylaxis: SCD/Compression stockings Code Status: Full code  Family Communication: sister updated at bedside today Status is: inpatient Dispo:   The patient is from: home Anticipated d/c is to: SNF Anticipated d/c date is: 1-2 days pending SNF placement Patient currently IS medically stable to d/c.   Subjective and Interval History:  Pt seen today with sister at bedside.  He had been working with PT a little earlier in the morning and had significant exacerbation of his hip pain.  Requesting pain medication.  He complains of frequent loose stools after receiving stool softeners and laxatives for severe constipation.  Has intermittent nausea without vomiting.  Denies fevers, chills, abdominal pain, chest pain or other acute complaints.   Objective: Vitals:   10/05/20 0000 10/05/20 0434 10/05/20 0810 10/05/20 1136  BP: (!) 152/89 (!) 153/88 (!) 147/86 (!) 144/87  Pulse: 74 76 72 79  Resp: 16 16 18 19   Temp: 97.9 F (36.6 C) 97.9 F (36.6 C) 97.7 F (36.5 C) 98.9 F (37.2 C)  TempSrc:   Oral Oral  SpO2: 98% 100% 100% 100%  Weight:      Height:        Intake/Output Summary (Last 24 hours) at 10/05/2020 1538 Last data filed at 10/05/2020 1500 Gross per 24 hour  Intake 480 ml  Output 302 ml  Net 178 ml   Filed Weights   10/01/20 0200 10/01/20 2230  Weight: 66.8 kg 73.1 kg    Examination:   Constitutional: awake, alert, NAD, resting in bed CV: RRR, normal S1/S2, no peripheral edema RESP: CTAB, on room air, normal respiratory effort Extremities: moves all,  normal tone GI: Soft, NT, hyperactive bowel sounds   Data Reviewed: I have personally reviewed following labs and imaging studies  CBC: Recent Labs  Lab 09/30/20 1202 09/30/20 1600 10/01/20 0458 10/01/20 1117 10/02/20 0529 10/02/20 1813 10/03/20 0600 10/03/20 1321 10/04/20 0547 10/05/20 0731  WBC 15.6*   < > 10.6*  --  11.0*  --  8.2  --  8.0 8.6  NEUTROABS 13.1*  --   --   --   --   --   --   --   --   --   HGB 11.1*   < > 8.7*   < > 6.4* 7.4* 7.0* 7.2* 8.2* 7.8*  HCT 34.2*   < > 26.3*  --  19.5* 22.2* 21.9* 21.6* 26.2* 24.0*  MCV 75.0*   < > 74.5*  --  75.9*  --  79.3*  --  81.4 78.2*  PLT 178   < > 119*  --  105*  --  108*  --  152 173   < > = values in this interval not displayed.   Basic Metabolic Panel: Recent Labs  Lab 09/30/20 1600 10/01/20 0458 10/01/20 1033 10/02/20 0529 10/03/20 0600 10/04/20 0547 10/05/20 0731  NA 131*   < > 132* 131* 133* 135 134*  K 3.2*   < > 4.9 3.6 3.1* 3.3* 2.8*  CL 91*   < > 96* 97* 100 99 99  CO2 27   < > 25 26 28 30 28   GLUCOSE 164*   < > 106* 160* 105* 114* 130*  BUN 7*   < > 10 13 16 15 12   CREATININE 0.79   < > 0.75 0.61 0.70 0.57* 0.44*  CALCIUM 7.9*   < > 8.9 8.3* 7.8* 8.0* 8.0*  MG 1.5*  --   --  1.3* 2.4 1.9 1.5*  PHOS 3.7  --   --   --   --  1.6* 2.5   < > = values in this interval not displayed.   GFR: Estimated Creatinine Clearance: 93.9 mL/min (A) (by C-G formula based on SCr of 0.44 mg/dL (L)). Liver Function Tests: Recent Labs  Lab 09/30/20 1202 09/30/20 1600  AST 38 38  ALT 14 12  ALKPHOS 76 56  BILITOT 1.2 1.2  PROT 7.2 5.2*  ALBUMIN 3.8 2.8*   No results for input(s): LIPASE, AMYLASE in the last 168 hours. No results for input(s): AMMONIA in the last 168 hours. Coagulation Profile: Recent Labs  Lab 09/30/20 1202 10/04/20 1542 10/05/20 0731  INR 1.0 1.0 1.0   Cardiac Enzymes: No results for input(s): CKTOTAL, CKMB, CKMBINDEX, TROPONINI in the last 168 hours. BNP (last 3 results) No results  for input(s): PROBNP in the last 8760 hours. HbA1C: No results for input(s): HGBA1C in the last 72 hours. CBG: No results for input(s): GLUCAP in the last 168 hours. Lipid Profile: No results for input(s): CHOL, HDL, LDLCALC, TRIG, CHOLHDL, LDLDIRECT in the last 72 hours. Thyroid Function Tests: No results for input(s): TSH, T4TOTAL, FREET4, T3FREE, THYROIDAB in the last 72 hours. Anemia Panel: No results for input(s): VITAMINB12, FOLATE, FERRITIN, TIBC, IRON, RETICCTPCT in the last 72 hours.  Sepsis Labs: Recent Labs  Lab 09/30/20 1236 09/30/20 1759 09/30/20 1938 09/30/20 2238 10/01/20 0458 10/02/20 0529  PROCALCITON  --  <0.10  --   --  3.01 3.35  LATICACIDVEN 6.2* 3.2* 4.0* 4.1*  --   --     Recent Results (from the past 240 hour(s))  SARS CORONAVIRUS 2 (TAT 6-24 HRS) Nasopharyngeal Nasopharyngeal Swab     Status: None   Collection Time: 09/30/20 12:02 PM   Specimen: Nasopharyngeal Swab  Result Value Ref Range Status   SARS Coronavirus 2 NEGATIVE NEGATIVE Final    Comment: (NOTE) SARS-CoV-2 target nucleic acids are NOT DETECTED.  The SARS-CoV-2 RNA is generally detectable in upper and lower respiratory specimens during the acute phase of infection. Negative results do not preclude SARS-CoV-2 infection, do not rule out co-infections with other pathogens, and should not be used as the sole basis for treatment or other patient management decisions. Negative results must be combined with clinical observations, patient history, and epidemiological information. The expected result is Negative.  Fact Sheet for Patients: HairSlick.no  Fact Sheet for Healthcare Providers: quierodirigir.com  This test is not yet approved or cleared by the [place] FDA and  has been authorized for detection and/or diagnosis of SARS-CoV-2 by FDA under an Emergency Use Authorization (EUA). This EUA will remain  in effect (meaning this  test can be used) for the duration of the COVID-19 declaration under Se ction 564(b)(1) of the Act, 21 U.S.C. section 360bbb-3(b)(1), unless the authorization is terminated or revoked sooner.  Performed at [facility], [address] [place], [place] [address]   Culture, blood (single)     Status: None   Collection Time: 09/30/20  2:06 PM   Specimen: BLOOD  Result Value Ref Range Status   Specimen Description BLOOD RIGHT ANTECUBITAL  Final   Special Requests   Final    BOTTLES DRAWN AEROBIC AND ANAEROBIC Blood Culture adequate volume   Culture   Final    NO GROWTH 5 DAYS Performed at [facility], [address], [place], [place] [address]    Report Status 10/05/2020 FINAL  Final      Radiology Studies: No results found.   Scheduled Meds: . sodium chloride   Intravenous Once  . acetaminophen  1,000 mg Oral Q8H  . docusate sodium  100 mg Oral BID  . enoxaparin (LOVENOX) injection  40 mg Subcutaneous Q24H  . feeding supplement  237 mL Oral TID BM  . folic acid  1 mg Oral Daily  . losartan  50 mg Oral Daily  . magnesium citrate  1 Bottle Oral Once  . metoprolol succinate  100 mg Oral Daily  . polyethylene glycol  17 g Oral Daily  . potassium chloride  40 mEq Oral Q4H  . senna-docusate  1 tablet Oral QHS  . thiamine  100 mg Oral Daily   Or  . thiamine  100 mg Intravenous Daily  . warfarin  7.5 mg Oral ONCE-1600  . Warfarin - Pharmacist Dosing Inpatient   Does not apply q1600   Continuous Infusions: . sodium chloride 75 mL/hr at 10/03/20 0316  . methocarbamol (ROBAXIN) IV       LOS: 5 days    Time spent: 25 minutes with > 50% spent in coordination of care and at bedside    Pennie Banter, MD [facility] If 7PM-7AM, please contact night-coverage 10/05/2020, 3:38 PM

## 2020-10-06 DIAGNOSIS — S72002S Fracture of unspecified part of neck of left femur, sequela: Secondary | ICD-10-CM | POA: Diagnosis not present

## 2020-10-06 DIAGNOSIS — R197 Diarrhea, unspecified: Secondary | ICD-10-CM

## 2020-10-06 DIAGNOSIS — D649 Anemia, unspecified: Secondary | ICD-10-CM

## 2020-10-06 DIAGNOSIS — I10 Essential (primary) hypertension: Secondary | ICD-10-CM

## 2020-10-06 DIAGNOSIS — Z952 Presence of prosthetic heart valve: Secondary | ICD-10-CM | POA: Diagnosis not present

## 2020-10-06 DIAGNOSIS — R112 Nausea with vomiting, unspecified: Secondary | ICD-10-CM

## 2020-10-06 DIAGNOSIS — E876 Hypokalemia: Secondary | ICD-10-CM

## 2020-10-06 LAB — BASIC METABOLIC PANEL
Anion gap: 8 (ref 5–15)
BUN: 11 mg/dL (ref 8–23)
CO2: 29 mmol/L (ref 22–32)
Calcium: 8.2 mg/dL — ABNORMAL LOW (ref 8.9–10.3)
Chloride: 98 mmol/L (ref 98–111)
Creatinine, Ser: 0.41 mg/dL — ABNORMAL LOW (ref 0.61–1.24)
GFR, Estimated: >60 mL/min (ref >60)
Glucose, Bld: 127 mg/dL — ABNORMAL HIGH (ref 70–99)
Potassium: 3.7 mmol/L (ref 3.5–5.1)
Sodium: 135 mmol/L (ref 135–145)

## 2020-10-06 LAB — CBC
HCT: 28.4 % — ABNORMAL LOW (ref 39.0–52.0)
Hemoglobin: 9.2 g/dL — ABNORMAL LOW (ref 13.0–17.0)
MCH: 25.6 pg — ABNORMAL LOW (ref 26.0–34.0)
MCHC: 32.4 g/dL (ref 30.0–36.0)
MCV: 79.1 fL — ABNORMAL LOW (ref 80.0–100.0)
Platelets: 224 10*3/uL (ref 150–400)
RBC: 3.59 MIL/uL — ABNORMAL LOW (ref 4.22–5.81)
RDW: 20.5 % — ABNORMAL HIGH (ref 11.5–15.5)
WBC: 11.5 10*3/uL — ABNORMAL HIGH (ref 4.0–10.5)
nRBC: 0 % (ref 0.0–0.2)

## 2020-10-06 LAB — MAGNESIUM: Magnesium: 1.8 mg/dL (ref 1.7–2.4)

## 2020-10-06 LAB — PROTIME-INR
INR: 2.4 — ABNORMAL HIGH (ref 0.8–1.2)
Prothrombin Time: 25.4 seconds — ABNORMAL HIGH (ref 11.4–15.2)

## 2020-10-06 MED ORDER — ALUM & MAG HYDROXIDE-SIMETH 200-200-20 MG/5ML PO SUSP
30.0000 mL | Freq: Four times a day (QID) | ORAL | Status: DC | PRN
  Administered 2020-10-08: 30 mL via ORAL
  Filled 2020-10-06: qty 30

## 2020-10-06 MED ORDER — FAMOTIDINE 20 MG PO TABS
20.0000 mg | ORAL_TABLET | Freq: Every day | ORAL | Status: DC
  Administered 2020-10-06: 20 mg via ORAL
  Filled 2020-10-06: qty 1

## 2020-10-06 MED ORDER — WARFARIN SODIUM 7.5 MG PO TABS
7.5000 mg | ORAL_TABLET | Freq: Every day | ORAL | Status: AC
  Administered 2020-10-06: 7.5 mg via ORAL
  Filled 2020-10-06: qty 1
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
## 2020-10-06 NOTE — Progress Notes (Addendum)
Night floor coverage Patient with shortness of breath requiring O2.  O2 sat 90.  CTA chest ordered to evaluate for PE  2:04 AM Addendum: Called by radiology.  CTA showing small segmental PE, but also showing possible ileus with recommendation for NG tube. Patient is already on Coumadin with therapeutic INR of 2.4.  Acute PE-continue Coumadin Ileus with gastric distention- Place NG tube Acute dyspnea/acute hypoxia-continue oxygen

## 2020-10-06 NOTE — Progress Notes (Signed)
Patient ID: Kenneth Mccall, male   DOB: [birth year], 65 y.o.   MRN: [id number] [facility] PROGRESS NOTE  Geremiah Fussell [id number] DOB: [birth year] DOA: 09/30/2020 PCP: System, Provider Not In  HPI/Subjective: Patient feeling miserable.  Had nausea vomiting and diarrhea.  Did not eat much for breakfast.  Still having some pain.  Objective: Vitals:   10/06/20 1141 10/06/20 1640  BP: (!) 152/90 128/82  Pulse: 91 87  Resp: 19 17  Temp: 98.8 F (37.1 C) 98.2 F (36.8 C)  SpO2: 100% 99%    Filed Weights   10/01/20 0200 10/01/20 2230  Weight: 66.8 kg 73.1 kg    ROS: Review of Systems  Respiratory: Negative for shortness of breath.   Cardiovascular: Negative for chest pain.  Gastrointestinal: Positive for diarrhea, nausea and vomiting. Negative for abdominal pain.   Exam: Physical Exam HENT:     Head: Normocephalic.     Mouth/Throat:     Pharynx: No oropharyngeal exudate.  Eyes:     General: Lids are normal.     Conjunctiva/sclera: Conjunctivae normal.     Pupils: Pupils are equal, round, and reactive to light.  Cardiovascular:     Rate and Rhythm: Normal rate and regular rhythm.     Heart sounds: Normal heart sounds, S1 normal and S2 normal.  Pulmonary:     Breath sounds: No decreased breath sounds, wheezing, rhonchi or rales.  Abdominal:     Palpations: Abdomen is soft.     Tenderness: There is no abdominal tenderness.  Musculoskeletal:     Right lower leg: No swelling.     Left lower leg: No swelling.  Skin:    General: Skin is warm.     Findings: No rash.  Neurological:     Mental Status: He is alert and oriented to person, place, and time.       Data Reviewed: Basic Metabolic Panel: Recent Labs  Lab 09/30/20 1600 10/01/20 0458 10/02/20 0529 10/03/20 0600 10/04/20 0547 10/05/20 0731 10/06/20 0756  NA 131*   < > 131* 133* 135 134* 135  K 3.2*   < > 3.6 3.1* 3.3* 2.8* 3.7  CL 91*   < > 97* 100 99 99 98  CO2 27   < > 26 28 30 28 29   GLUCOSE  164*   < > 160* 105* 114* 130* 127*  BUN 7*   < > 13 16 15 12 11   CREATININE 0.79   < > 0.61 0.70 0.57* 0.44* 0.41*  CALCIUM 7.9*   < > 8.3* 7.8* 8.0* 8.0* 8.2*  MG 1.5*  --  1.3* 2.4 1.9 1.5* 1.8  PHOS 3.7  --   --   --  1.6* 2.5  --    < > = values in this interval not displayed.   Liver Function Tests: Recent Labs  Lab 09/30/20 1202 09/30/20 1600  AST 38 38  ALT 14 12  ALKPHOS 76 56  BILITOT 1.2 1.2  PROT 7.2 5.2*  ALBUMIN 3.8 2.8*   CBC: Recent Labs  Lab 09/30/20 1202 09/30/20 1600 10/02/20 0529 10/02/20 1813 10/03/20 0600 10/03/20 1321 10/04/20 0547 10/05/20 0731 10/06/20 0756  WBC 15.6*   < > 11.0*  --  8.2  --  8.0 8.6 11.5*  NEUTROABS 13.1*  --   --   --   --   --   --   --   --   HGB 11.1*   < > 6.4*   < > 7.0* 7.2*  8.2* 7.8* 9.2*  HCT 34.2*   < > 19.5*   < > 21.9* 21.6* 26.2* 24.0* 28.4*  MCV 75.0*   < > 75.9*  --  79.3*  --  81.4 78.2* 79.1*  PLT 178   < > 105*  --  108*  --  152 173 224   < > = values in this interval not displayed.   BNP (last 3 results) Recent Labs    09/30/20 1600  BNP 123.5*     Recent Results (from the past 240 hour(s))  SARS CORONAVIRUS 2 (TAT 6-24 HRS) Nasopharyngeal Nasopharyngeal Swab     Status: None   Collection Time: 09/30/20 12:02 PM   Specimen: Nasopharyngeal Swab  Result Value Ref Range Status   SARS Coronavirus 2 NEGATIVE NEGATIVE Final    Comment: (NOTE) SARS-CoV-2 target nucleic acids are NOT DETECTED.  The SARS-CoV-2 RNA is generally detectable in upper and lower respiratory specimens during the acute phase of infection. Negative results do not preclude SARS-CoV-2 infection, do not rule out co-infections with other pathogens, and should not be used as the sole basis for treatment or other patient management decisions. Negative results must be combined with clinical observations, patient history, and epidemiological information. The expected result is Negative.  Fact Sheet for  Patients: SugarRoll.be  Fact Sheet for Healthcare Providers: https://www.woods-mathews.com/  This test is not yet approved or cleared by the [place] FDA and  has been authorized for detection and/or diagnosis of SARS-CoV-2 by FDA under an Emergency Use Authorization (EUA). This EUA will remain  in effect (meaning this test can be used) for the duration of the COVID-19 declaration under Se ction 564(b)(1) of the Act, 21 U.S.C. section 360bbb-3(b)(1), unless the authorization is terminated or revoked sooner.  Performed at [facility], [place] [address], [place], [place] [address]   Culture, blood (single)     Status: None   Collection Time: 09/30/20  2:06 PM   Specimen: BLOOD  Result Value Ref Range Status   Specimen Description BLOOD RIGHT ANTECUBITAL  Final   Special Requests   Final    BOTTLES DRAWN AEROBIC AND ANAEROBIC Blood Culture adequate volume   Culture   Final    NO GROWTH 5 DAYS Performed at [facility], [address] [place], [place] [address]    Report Status 10/05/2020 FINAL  Final     Scheduled Meds: . sodium chloride   Intravenous Once  . acetaminophen  1,000 mg Oral Q8H  . feeding supplement  237 mL Oral TID BM  . folic acid  1 mg Oral Daily  . losartan  50 mg Oral Daily  . magnesium citrate  1 Bottle Oral Once  . metoprolol succinate  100 mg Oral Daily  . thiamine  100 mg Oral Daily   Or  . thiamine  100 mg Intravenous Daily  . warfarin  7.5 mg Oral q1600  . Warfarin - Pharmacist Dosing Inpatient   Does not apply q1600   Continuous Infusions: . sodium chloride 75 mL/hr at 10/03/20 0316  . methocarbamol (ROBAXIN) IV      Assessment/Plan:  1. Left proximal femur fracture status post operative repair on 10/01/2020.  Postoperative day 5.  As per transitional care team [facility].  6 to 24-hour Covid sent today. 2. Nausea vomiting and diarrhea.  Stop all  anticonstipation medications and watch diet today and as needed nausea medications. 3. History of aortic valve replacement continue  warfarin. 4. Electrolyte abnormalities including hypokalemia hypomagnesemia and hypophosphatemia continue monitoring. 5. Essential hypertension on metoprolol and losartan 6. Postoperative anemia.  Transfused 1 unit of packed red blood cells during the hospital course.  Last hemoglobin 9.2.        Code Status:     Code Status Orders  (From admission, onward)         Start     Ordered   09/30/20 1603  Full code  Continuous        09/30/20 1609        Code Status History    This patient has a current code status but no historical code status.   Advance Care Planning Activity     Disposition Plan: Status is: Inpatient  Dispo:  Patient From: Home  Planned Disposition: [place].  6 to 24-hour Covid sent off today.  Expected discharge date: 10/07/2020  Medically stable for discharge: Yes  Time spent: 28 minutes  [place]
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
## 2020-10-06 NOTE — TOC Progression Note (Signed)
Transition of Care [facility]) - Progression Note    Patient Details  Name: Kenneth Mccall MRN: [id number] Date of Birth: 1956/06/12  Transition of Care [facility]) CM/SW [place], RN Phone Number: 10/06/2020, 11:03 AM  Clinical Narrative:   RNCM met with patient at bedside to discuss bed options of [facility] and [facility] in [place], [place].gov ratings provided. Patient ultimately chose [facility]. RNCM will start insurance authorization through [employer] portal.     Expected Discharge Plan: Home/Self Care Barriers to Discharge: Continued Medical Work up  Expected Discharge Plan and Services Expected Discharge Plan: Home/Self Care   Discharge Planning Services: NA Post Acute Care Choice: [place] arrangements for the past 2 months: Apartment                 DME Arranged: N/A DME Agency: NA       HH Arranged: NA HH Agency: NA         Social Determinants of Health (SDOH) Interventions    Readmission Risk Interventions No flowsheet data found.
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
## 2020-10-07 ENCOUNTER — Inpatient Hospital Stay: Payer: Medicare Other

## 2020-10-07 ENCOUNTER — Encounter: Payer: Self-pay | Admitting: Internal Medicine

## 2020-10-07 DIAGNOSIS — S72002S Fracture of unspecified part of neck of left femur, sequela: Secondary | ICD-10-CM | POA: Diagnosis not present

## 2020-10-07 DIAGNOSIS — Z952 Presence of prosthetic heart valve: Secondary | ICD-10-CM | POA: Diagnosis not present

## 2020-10-07 DIAGNOSIS — K310 Acute dilatation of stomach: Secondary | ICD-10-CM | POA: Diagnosis not present

## 2020-10-07 DIAGNOSIS — I2699 Other pulmonary embolism without acute cor pulmonale: Secondary | ICD-10-CM

## 2020-10-07 LAB — MAGNESIUM: Magnesium: 1.7 mg/dL (ref 1.7–2.4)

## 2020-10-07 LAB — BASIC METABOLIC PANEL
Anion gap: 12 (ref 5–15)
BUN: 19 mg/dL (ref 8–23)
CO2: 30 mmol/L (ref 22–32)
Calcium: 8.4 mg/dL — ABNORMAL LOW (ref 8.9–10.3)
Chloride: 92 mmol/L — ABNORMAL LOW (ref 98–111)
Creatinine, Ser: 0.92 mg/dL (ref 0.61–1.24)
GFR, Estimated: >60 mL/min (ref >60)
Glucose, Bld: 101 mg/dL — ABNORMAL HIGH (ref 70–99)
Potassium: 3.8 mmol/L (ref 3.5–5.1)
Sodium: 134 mmol/L — ABNORMAL LOW (ref 135–145)

## 2020-10-07 LAB — CBC
HCT: 27.9 % — ABNORMAL LOW (ref 39.0–52.0)
Hemoglobin: 9.1 g/dL — ABNORMAL LOW (ref 13.0–17.0)
MCH: 26.1 pg (ref 26.0–34.0)
MCHC: 32.6 g/dL (ref 30.0–36.0)
MCV: 79.9 fL — ABNORMAL LOW (ref 80.0–100.0)
Platelets: 277 10*3/uL (ref 150–400)
RBC: 3.49 MIL/uL — ABNORMAL LOW (ref 4.22–5.81)
RDW: 20.7 % — ABNORMAL HIGH (ref 11.5–15.5)
WBC: 13.6 10*3/uL — ABNORMAL HIGH (ref 4.0–10.5)
nRBC: 0 % (ref 0.0–0.2)

## 2020-10-07 LAB — PROTIME-INR
INR: 4.3 (ref 0.8–1.2)
Prothrombin Time: 39.9 seconds — ABNORMAL HIGH (ref 11.4–15.2)

## 2020-10-07 LAB — PHOSPHORUS: Phosphorus: 3.2 mg/dL (ref 2.5–4.6)

## 2020-10-07 LAB — SARS CORONAVIRUS 2 (TAT 6-24 HRS): SARS Coronavirus 2: NEGATIVE

## 2020-10-07 MED ORDER — IOHEXOL 350 MG/ML SOLN
75.0000 mL | Freq: Once | INTRAVENOUS | Status: AC | PRN
  Administered 2020-10-07: 75 mL via INTRAVENOUS

## 2020-10-07 MED ORDER — PANTOPRAZOLE SODIUM 40 MG IV SOLR
40.0000 mg | Freq: Two times a day (BID) | INTRAVENOUS | Status: DC
  Administered 2020-10-07 – 2020-10-08 (×3): 40 mg via INTRAVENOUS
  Filled 2020-10-07 (×2): qty 40

## 2020-10-07 MED ORDER — SODIUM CHLORIDE 0.9 % IV SOLN: INTRAVENOUS | Status: DC

## 2020-10-07 NOTE — Progress Notes (Signed)
PT Cancellation Note  Patient Details Name: Kenneth Mccall MRN: [id number] DOB: 1956/03/06   Cancelled Treatment:    Reason Eval/Treat Not Completed: Patient at procedure or test/unavailable.  Of note, per chart review pt with "small PE" and already therapeutic on coumadin.  Per Dr. Renae Gloss OK for pt to participate with PT services.  Will attempt to see pt at a future date/time as medically appropriate.     Ovidio Hanger PT, DPT 10/07/20, 9:50 AM

## 2020-10-07 NOTE — TOC Progression Note (Signed)
Transition of Care [facility]) - Progression Note    Patient Details  Name: Kenneth Mccall MRN: [id number] Date of Birth: 08/01/1956  [facility] CM/SW Contact  Trenton Founds, RN Phone Number: 10/07/2020, 9:07 AM  Clinical Narrative:   RNCM received insurance authorization for patient with plan auth ID of [id number] and Navi reference number of [id number]. Authorization is good through 1/7 with that being next review date.     Expected Discharge Plan: Home/Self Care Barriers to Discharge: Continued Medical Work up  Expected Discharge Plan and Services Expected Discharge Plan: Home/Self Care   Discharge Planning Services: NA Post Acute Care Choice: Home Health Living arrangements for the past 2 months: Apartment                 DME Arranged: N/A DME Agency: NA       HH Arranged: NA HH Agency: NA         Social Determinants of Health (SDOH) Interventions    Readmission Risk Interventions No flowsheet data found.
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
## 2020-10-07 NOTE — Progress Notes (Signed)
Patient ID: Kenneth Mccall, male   DOB: 11-14-1955, 65 y.o.   MRN: [id number] [facility] PROGRESS NOTE  Kenneth Mccall IHK:[id number] DOB: [birth year] DOA: 09/30/2020 PCP: System, Provider Not In  HPI/Subjective: Patient was seen this morning and again late morning.  Patient doing better than he was last night.  Patient had a CAT scan of the chest that showed a distended stomach and ileus and pulmonary embolism.  Patient does not complain of any shortness of breath.  This morning he felt better and wanted to start some liquids and some ice chips.  Did well with that without any further vomiting with NG tube clamped.  Initially admitted with hip fracture.  Objective: Vitals:   10/07/20 0758 10/07/20 1143  BP: 126/77 139/79  Pulse: 97 89  Resp: 16 15  Temp: 98.2 F (36.8 C) 98.5 F (36.9 C)  SpO2: 100% 98%    Intake/Output Summary (Last 24 hours) at 10/07/2020 1216 Last data filed at 10/07/2020 0400 Gross per 24 hour  Intake --  Output 1750 ml  Net -1750 ml   Filed Weights   10/01/20 0200 10/01/20 2230  Weight: 66.8 kg 73.1 kg    ROS: Review of Systems  Respiratory: Negative for shortness of breath.   Cardiovascular: Negative for chest pain.  Gastrointestinal: Negative for abdominal pain, nausea and vomiting.  Musculoskeletal: Positive for joint pain.   Exam: Physical Exam HENT:     Head: Normocephalic.     Mouth/Throat:     Pharynx: No oropharyngeal exudate.  Eyes:     General: Lids are normal.     Pupils: Pupils are equal, round, and reactive to light.  Cardiovascular:     Rate and Rhythm: Normal rate and regular rhythm.     Heart sounds: Normal heart sounds, S1 normal and S2 normal.  Pulmonary:     Breath sounds: No decreased breath sounds, wheezing or rhonchi.  Abdominal:     Palpations: Abdomen is soft.     Tenderness: There is no abdominal tenderness.  Musculoskeletal:     Right lower leg: No swelling.     Left lower leg: No swelling.  Skin:    General:  Skin is warm.     Findings: No rash.  Neurological:     Mental Status: He is alert and oriented to person, place, and time.       Data Reviewed: Basic Metabolic Panel: Recent Labs  Lab 09/30/20 1600 10/01/20 0458 10/03/20 0600 10/04/20 0547 10/05/20 0731 10/06/20 0756 10/07/20 0807  NA 131*   < > 133* 135 134* 135 134*  K 3.2*   < > 3.1* 3.3* 2.8* 3.7 3.8  CL 91*   < > 100 99 99 98 92*  CO2 27   < > 28 30 28 29 30   GLUCOSE 164*   < > 105* 114* 130* 127* 101*  BUN 7*   < > 16 15 12 11 19   CREATININE 0.79   < > 0.70 0.57* 0.44* 0.41* 0.92  CALCIUM 7.9*   < > 7.8* 8.0* 8.0* 8.2* 8.4*  MG 1.5*   < > 2.4 1.9 1.5* 1.8 1.7  PHOS 3.7  --   --  1.6* 2.5  --  3.2   < > = values in this interval not displayed.   Liver Function Tests: Recent Labs  Lab 09/30/20 1600  AST 38  ALT 12  ALKPHOS 56  BILITOT 1.2  PROT 5.2*  ALBUMIN 2.8*   CBC: Recent Labs  Lab  10/03/20 0600 10/03/20 1321 10/04/20 0547 10/05/20 0731 10/06/20 0756 10/07/20 0807  WBC 8.2  --  8.0 8.6 11.5* 13.6*  HGB 7.0* 7.2* 8.2* 7.8* 9.2* 9.1*  HCT 21.9* 21.6* 26.2* 24.0* 28.4* 27.9*  MCV 79.3*  --  81.4 78.2* 79.1* 79.9*  PLT 108*  --  152 173 224 277   BNP (last 3 results) Recent Labs    09/30/20 1600  BNP 123.5*     Recent Results (from the past 240 hour(s))  SARS CORONAVIRUS 2 (TAT 6-24 HRS) Nasopharyngeal Nasopharyngeal Swab     Status: None   Collection Time: 09/30/20 12:02 PM   Specimen: Nasopharyngeal Swab  Result Value Ref Range Status   SARS Coronavirus 2 NEGATIVE NEGATIVE Final    Comment: (NOTE) SARS-CoV-2 target nucleic acids are NOT DETECTED.  The SARS-CoV-2 RNA is generally detectable in upper and lower respiratory specimens during the acute phase of infection. Negative results do not preclude SARS-CoV-2 infection, do not rule out co-infections with other pathogens, and should not be used as the sole basis for treatment or other patient management decisions. Negative results  must be combined with clinical observations, patient history, and epidemiological information. The expected result is Negative.  Fact Sheet for Patients: SugarRoll.be  Fact Sheet for Healthcare Providers: https://www.woods-mathews.com/  This test is not yet approved or cleared by the [place] FDA and  has been authorized for detection and/or diagnosis of SARS-CoV-2 by FDA under an Emergency Use Authorization (EUA). This EUA will remain  in effect (meaning this test can be used) for the duration of the COVID-19 declaration under Se ction 564(b)(1) of the Act, 21 U.S.C. section 360bbb-3(b)(1), unless the authorization is terminated or revoked sooner.  Performed at [facility], [place] [address] [place], [place] [address]   Culture, blood (single)     Status: None   Collection Time: 09/30/20  2:06 PM   Specimen: BLOOD  Result Value Ref Range Status   Specimen Description BLOOD RIGHT ANTECUBITAL  Final   Special Requests   Final    BOTTLES DRAWN AEROBIC AND ANAEROBIC Blood Culture adequate volume   Culture   Final    NO GROWTH 5 DAYS Performed at [facility], [address], [place], [place] [address]    Report Status 10/05/2020 FINAL  Final  SARS CORONAVIRUS 2 (TAT 6-24 HRS) Nasopharyngeal Nasopharyngeal Swab     Status: None   Collection Time: 10/06/20  4:34 PM   Specimen: Nasopharyngeal Swab  Result Value Ref Range Status   SARS Coronavirus 2 NEGATIVE NEGATIVE Final    Comment: (NOTE) SARS-CoV-2 target nucleic acids are NOT DETECTED.  The SARS-CoV-2 RNA is generally detectable in upper and lower respiratory specimens during the acute phase of infection. Negative results do not preclude SARS-CoV-2 infection, do not rule out co-infections with other pathogens, and should not be used as the sole basis for treatment or other patient management decisions. Negative results must be combined with clinical  observations, patient history, and epidemiological information. The expected result is Negative.  Fact Sheet for Patients: SugarRoll.be  Fact Sheet for Healthcare Providers: https://www.woods-mathews.com/  This test is not yet approved or cleared by the [place] FDA and  has been authorized for detection and/or diagnosis of SARS-CoV-2 by FDA under an Emergency Use Authorization (EUA). This EUA will remain  in effect (meaning this test can be used) for the duration of the COVID-19 declaration under Se ction 564(b)(1) of the Act, 21 U.S.C. section 360bbb-3(b)(1), unless  the authorization is terminated or revoked sooner.  Performed at [facility], [place] [address], [place], [place] [address]      Studies: CT ANGIO CHEST PE W OR WO CONTRAST  Addendum Date: 10/07/2020   ADDENDUM REPORT: 10/07/2020 02:05 ADDENDUM: These results were called by telephone at the time of interpretation on 10/07/2020 at 2:02 am to provider [facility] , who verbally acknowledged these results. Electronically Signed   By: Fidela Salisbury MD   On: 10/07/2020 02:05   Result Date: 10/07/2020 CLINICAL DATA:  Dyspnea, recent orthopedic surgery EXAM: CT ANGIOGRAPHY CHEST WITH CONTRAST TECHNIQUE: Multidetector CT imaging of the chest was performed using the standard protocol during bolus administration of intravenous contrast. Multiplanar CT image reconstructions and MIPs were obtained to evaluate the vascular anatomy. CONTRAST:  73mL OMNIPAQUE IOHEXOL 350 MG/ML SOLN COMPARISON:  09/30/2020 FINDINGS: Cardiovascular: There is adequate opacification of the pulmonary arterial tree. Since the prior examination, there has developed a single small intraluminal filling defect within the a anterior segmental branch of the right lower lobe, best seen on axial image # 41/4. The embolic burden is tiny. The central pulmonary arteries are of normal caliber. There is no CT evidence of right  heart strain. There is extensive coronary artery calcification within the native coronary arteries. 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 Left ventricular hypertrophy is noted. Global cardiac size within normal limits. Aortic valve replacement and thoracic aortic aneurysm repair have been performed there is stable dilation of the proximal descending thoracic aorta which measures 3.9 cm at the level of the aortic arch and 3.5 cm at the level of the left atrium Mediastinum/Nodes: The esophagus is distally fluid-filled and mildly distended suggesting changes of gastroesophageal reflux. There is no pathologic thoracic adenopathy. Lungs/Pleura: Nodular pleural thickening within the right major fissure laterally is new from prior examination and nonspecific. Stable elevation of the right hemidiaphragm. Interval development of a tiny left pleural effusion associated left basilar atelectasis. No pneumothorax. No central obstructing lesion. Upper Abdomen: The stomach is fluid-filled and distended. Cholelithiasis noted. Musculoskeletal: Extensive lumbar fusion with instrumentation has been performed. Scoliosis rods are in place. The osseous structures appear diffusely osteopenic. Remote fracture deformity of the medial right clavicle. No acute bone abnormality Review of the MIP images confirms the above findings. IMPRESSION: Acute pulmonary embolism. The embolic burden is tiny and there is no CT evidence of right heart strain. Marked fluid distension of the stomach with fluid within the distal esophagus likely related to gastroesophageal reflux. Nasogastric intubation may be helpful for further management. Surgical changes of thoracic aortic aneurysm repair and aortic valve replacement. 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 Recommend annual imaging followup by CTA or MRA. This recommendation follows 2010 ACCF/AHA/AATS/ACR/ASA/SCA/SCAI/SIR/STS/SVM Guidelines for the  Diagnosis and Management of Patients with Thoracic Aortic Disease. Circulation.2010; [id number]:[id number]. Aortic aneurysm NOS (ICD10-I71.9) Aortic Atherosclerosis (ICD10-I70.0). Attempts are being made at this time to contact managing clinician for direct communication. Electronically Signed: By: Fidela Salisbury MD On: 10/07/2020 01:56   US Venous Img Lower Bilateral (DVT)  Result Date: 10/07/2020 CLINICAL DATA:  65 year old male with pulmonary embolism EXAM: BILATERAL LOWER EXTREMITY VENOUS DOPPLER ULTRASOUND TECHNIQUE: Gray-scale sonography with graded compression, as well as color Doppler and duplex ultrasound were performed to evaluate the lower extremity deep venous systems from the level of the common femoral vein and including the common femoral, femoral, profunda femoral, popliteal and calf veins including the posterior tibial, peroneal and  gastrocnemius veins when visible. The superficial great saphenous vein was also interrogated. Spectral Doppler was utilized to evaluate flow at rest and with distal augmentation maneuvers in the common femoral, femoral and popliteal veins. COMPARISON:  None. FINDINGS: RIGHT LOWER EXTREMITY Common Femoral Vein: No evidence of thrombus. Normal compressibility, respiratory phasicity and response to augmentation. Saphenofemoral Junction: No evidence of thrombus. Normal compressibility and flow on color Doppler imaging. Profunda Femoral Vein: No evidence of thrombus. Normal compressibility and flow on color Doppler imaging. Femoral Vein: No evidence of thrombus. Normal compressibility, respiratory phasicity and response to augmentation. Popliteal Vein: No evidence of thrombus. Normal compressibility, respiratory phasicity and response to augmentation. Calf Veins: No evidence of thrombus. Normal compressibility and flow on color Doppler imaging. Superficial Great Saphenous Vein: No evidence of thrombus. Normal compressibility and flow on color Doppler imaging. Other Findings:   None. LEFT LOWER EXTREMITY Common Femoral Vein: No evidence of thrombus. Normal compressibility, respiratory phasicity and response to augmentation. Saphenofemoral Junction: No evidence of thrombus. Normal compressibility and flow on color Doppler imaging. Profunda Femoral Vein: No evidence of thrombus. Normal compressibility and flow on color Doppler imaging. Femoral Vein: No evidence of thrombus. Normal compressibility, respiratory phasicity and response to augmentation. Popliteal Vein: No evidence of thrombus. Normal compressibility, respiratory phasicity and response to augmentation. Calf Veins: Posterior tibial vein patent with maintained flow. No flow or compressibility of 1 of the left peroneal veins. Superficial Great Saphenous Vein: No evidence of thrombus. Normal compressibility and flow on color Doppler imaging. Other Findings:  None. IMPRESSION: Sonographic survey of the left lower extremity negative for proximal DVT of the common femoral vein, femoral vein, popliteal vein Positive for occlusive DVT of peroneal vein without extension into the popliteal vein. Sonographic survey of the right lower extremity negative for DVT. Electronically Signed   By: Corrie Mckusick D.O.   On: 10/07/2020 09:40    Scheduled Meds: . sodium chloride   Intravenous Once  . acetaminophen  1,000 mg Oral Q8H  . feeding supplement  237 mL Oral TID BM  . folic acid  1 mg Oral Daily  . losartan  50 mg Oral Daily  . magnesium citrate  1 Bottle Oral Once  . metoprolol succinate  100 mg Oral Daily  . pantoprazole (PROTONIX) IV  40 mg Intravenous Q12H  . thiamine  100 mg Oral Daily   Or  . thiamine  100 mg Intravenous Daily  . Warfarin - Pharmacist Dosing Inpatient   Does not apply q1600   Continuous Infusions: . sodium chloride 75 mL/hr at 10/03/20 0316  . methocarbamol (ROBAXIN) IV      Assessment/Plan:  1. Distended stomach and ileus.  I clamped NG tube this morning and patient tolerated liquids will get rid of  NG tube this afternoon and hopefully restart diet later on today.   2. Acute pulmonary embolism and left peroneal DVT.  Patient therapeutic on Coumadin will continue at this point.  Patient not having any shortness of breath.   3. Left proximal femur fracture status post operative repair on 10/01/2020.  Therapeutic on Coumadin.  Pain control with oral medications. 4. History of aortic valve replacement.  On warfarin.  Level supratherapeutic today.  Holding warfarin today. 5. Electrolyte abnormalities including hypokalemia, hypomagnesemia and hypophosphatemia.  Continue monitoring.  6. Postoperative anemia.  Hemoglobin today 9.1. 7. Essential hypertension on metoprolol and losartan    Code Status:     Code Status Orders  (From admission, onward)  Start     Ordered   09/30/20 1603  Full code  Continuous        09/30/20 1609        Code Status History    This patient has a current code status but no historical code status.   Advance Care Planning Activity     Family Communication: Patient's sister at the bedside Disposition Plan: Status is: Inpatient  Dispo:  Patient From: Home  Planned Disposition: [place]  Expected discharge date: 10/08/2020 potentially.  Medically needs to be able to tolerate diet without nausea vomiting prior to be going out to rehab.  Also would like to see INR not going up.  Time spent: 29 minutes  Amanda
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
## 2020-10-07 NOTE — Care Management Important Message (Signed)
Important Message  Patient Details  Name: Kenneth Mccall MRN: [id number] Date of Birth: 18-Oct-1955   Medicare Important Message Given:  Yes     Trenton Founds, RN 10/07/2020, 3:49 PM

## 2020-10-08 DIAGNOSIS — K310 Acute dilatation of stomach: Secondary | ICD-10-CM | POA: Diagnosis not present

## 2020-10-08 DIAGNOSIS — Z952 Presence of prosthetic heart valve: Secondary | ICD-10-CM | POA: Diagnosis not present

## 2020-10-08 DIAGNOSIS — E440 Moderate protein-calorie malnutrition: Secondary | ICD-10-CM | POA: Insufficient documentation

## 2020-10-08 DIAGNOSIS — S72002S Fracture of unspecified part of neck of left femur, sequela: Secondary | ICD-10-CM | POA: Diagnosis not present

## 2020-10-08 DIAGNOSIS — I2699 Other pulmonary embolism without acute cor pulmonale: Secondary | ICD-10-CM | POA: Diagnosis not present

## 2020-10-08 LAB — PROTIME-INR
INR: 2.8 — ABNORMAL HIGH (ref 0.8–1.2)
Prothrombin Time: 28.2 seconds — ABNORMAL HIGH (ref 11.4–15.2)

## 2020-10-08 LAB — PREPARE RBC (CROSSMATCH)

## 2020-10-08 LAB — BASIC METABOLIC PANEL
Anion gap: 6 (ref 5–15)
BUN: 15 mg/dL (ref 8–23)
CO2: 31 mmol/L (ref 22–32)
Calcium: 8.3 mg/dL — ABNORMAL LOW (ref 8.9–10.3)
Chloride: 98 mmol/L (ref 98–111)
Creatinine, Ser: 0.61 mg/dL (ref 0.61–1.24)
GFR, Estimated: >60 mL/min (ref >60)
Glucose, Bld: 96 mg/dL (ref 70–99)
Potassium: 3.4 mmol/L — ABNORMAL LOW (ref 3.5–5.1)
Sodium: 135 mmol/L (ref 135–145)

## 2020-10-08 LAB — CBC
HCT: 23.1 % — ABNORMAL LOW (ref 39.0–52.0)
Hemoglobin: 7.1 g/dL — ABNORMAL LOW (ref 13.0–17.0)
MCH: 24.8 pg — ABNORMAL LOW (ref 26.0–34.0)
MCHC: 30.7 g/dL (ref 30.0–36.0)
MCV: 80.8 fL (ref 80.0–100.0)
Platelets: 287 10*3/uL (ref 150–400)
RBC: 2.86 MIL/uL — ABNORMAL LOW (ref 4.22–5.81)
RDW: 20.1 % — ABNORMAL HIGH (ref 11.5–15.5)
WBC: 10.8 10*3/uL — ABNORMAL HIGH (ref 4.0–10.5)
nRBC: 0 % (ref 0.0–0.2)

## 2020-10-08 MED ORDER — ALUM & MAG HYDROXIDE-SIMETH 200-200-20 MG/5ML PO SUSP: 30.0000 mL | Freq: Four times a day (QID) | ORAL | 0 refills | Status: DC | PRN

## 2020-10-08 MED ORDER — METOCLOPRAMIDE HCL 5 MG PO TABS: 5.0000 mg | ORAL_TABLET | Freq: Three times a day (TID) | ORAL | 0 refills | Status: AC | PRN

## 2020-10-08 MED ORDER — FUROSEMIDE 10 MG/ML IJ SOLN
40.0000 mg | Freq: Once | INTRAMUSCULAR | Status: AC
  Administered 2020-10-08: 40 mg via INTRAVENOUS
  Filled 2020-10-08: qty 4

## 2020-10-08 MED ORDER — BOOST / RESOURCE BREEZE PO LIQD CUSTOM
1.0000 | Freq: Three times a day (TID) | ORAL | Status: DC
  Administered 2020-10-08: 1 via ORAL

## 2020-10-08 MED ORDER — TRAZODONE HCL 50 MG PO TABS: 50.0000 mg | ORAL_TABLET | Freq: Every evening | ORAL | 0 refills | Status: AC | PRN

## 2020-10-08 MED ORDER — PANTOPRAZOLE SODIUM 40 MG PO TBEC: 40.0000 mg | DELAYED_RELEASE_TABLET | Freq: Two times a day (BID) | ORAL | 0 refills | Status: DC

## 2020-10-08 MED ORDER — TRAZODONE HCL 50 MG PO TABS: 50.0000 mg | ORAL_TABLET | Freq: Every day | ORAL | Status: DC

## 2020-10-08 MED ORDER — WARFARIN SODIUM 5 MG PO TABS: 5.0000 mg | ORAL_TABLET | Freq: Every day | ORAL | 0 refills | Status: AC

## 2020-10-08 MED ORDER — WARFARIN SODIUM 7.5 MG PO TABS: 7.5000 mg | ORAL_TABLET | Freq: Once | ORAL | Status: DC

## 2020-10-08 MED ORDER — ADULT MULTIVITAMIN W/MINERALS CH: 1.0000 | ORAL_TABLET | Freq: Every day | ORAL | Status: DC

## 2020-10-08 MED ORDER — MAGNESIUM OXIDE 400 MG PO TABS: 2.0000 | ORAL_TABLET | Freq: Every day | ORAL | 0 refills | Status: DC

## 2020-10-08 MED ORDER — THIAMINE HCL 100 MG PO TABS
100.0000 mg | ORAL_TABLET | Freq: Every day | ORAL | 0 refills | Status: AC
Start: 2020-10-08 — End: ?

## 2020-10-08 MED ORDER — POLYETHYLENE GLYCOL 3350 17 G PO PACK: 17.0000 g | PACK | Freq: Every day | ORAL | 0 refills | Status: DC | PRN

## 2020-10-08 MED ORDER — SODIUM CHLORIDE 0.9% IV SOLUTION: Freq: Once | INTRAVENOUS | Status: AC

## 2020-10-08 MED ORDER — WARFARIN SODIUM 5 MG PO TABS
5.0000 mg | ORAL_TABLET | Freq: Once | ORAL | Status: AC
  Administered 2020-10-08: 5 mg via ORAL
  Filled 2020-10-08: qty 1

## 2020-10-08 MED ORDER — POLYETHYLENE GLYCOL 3350 17 G PO PACK: 17.0000 g | PACK | Freq: Every day | ORAL | Status: DC

## 2020-10-08 MED ORDER — ACETAMINOPHEN 325 MG PO TABS
650.0000 mg | ORAL_TABLET | Freq: Once | ORAL | Status: AC
  Administered 2020-10-08: 650 mg via ORAL
  Filled 2020-10-08: qty 2

## 2020-10-08 MED ORDER — POTASSIUM CHLORIDE CRYS ER 10 MEQ PO TBCR: 20.0000 meq | EXTENDED_RELEASE_TABLET | Freq: Two times a day (BID) | ORAL | 0 refills | Status: DC

## 2020-10-08 NOTE — Progress Notes (Signed)
OT Cancellation Note  Patient Details Name: Shaheen Mende MRN: [id number] DOB: 15-May-1956   Cancelled Treatment:    Reason Eval/Treat Not Completed: Medical issues which prohibited therapy. Transfusion ordered due to severe drop in Hgb from previous date. Will hold and see at later date/time as able.   Dessie Coma, M.S. OTR/L  10/08/20, 2:03 PM  ascom [phone]

## 2020-10-08 NOTE — Progress Notes (Signed)
Nutrition Follow-up  DOCUMENTATION CODES:   Non-severe (moderate) malnutrition in context of social or environmental circumstances  INTERVENTION:  Discontinued Ensure Enlive.  Provide Boost Breeze po TID, each supplement provides 250 kcal and 9 grams of protein.  Provide Magic cup TID with meals, each supplement provides 290 kcal and 9 grams of protein.   Provide MVI po daily.  NUTRITION DIAGNOSIS:   Moderate Malnutrition related to social / environmental circumstances (EtOH abuse, suspected inadequate oral intake) as evidenced by moderate fat depletion,moderate muscle depletion,severe muscle depletion.  New nutrition diagnosis.  GOAL:   Patient will meet greater than or equal to 90% of their needs  Progressing.  MONITOR:   PO intake,Supplement acceptance,Labs,Weight trends,I & O's  REASON FOR ASSESSMENT:   Consult Hip fracture protocol  ASSESSMENT:   65 y.o. male with medical history significant of  HTN, aortic dissection s/p repairaortic valve repair all in 2012 now with mechanical heart valve on warfarin but has been noncompliant, multiple falls due to chronic debility in setting of ETOH abuse, chronic back pain, HepC and Horseshoe kidney who presents with L hip fracture s/p mechanical fall  12/31 s/p IM nailing of left femur  Met with patient at bedside this morning. His diet had been downgraded to clear liquids on 1/6 due to questionable ileus. He had an NGT but it was clamped and then later removed. Diet was advanced. Patient reports his appetite is fairly good today. He ended up eating 100% of his breakfast this morning after RD assessment. Patient was somewhat confused and unable to provide exact details on intake PTA or during admission. Limited meal completion documentation available in chart. Patient reports he does not like the Ensure as it gives him diarrhea. He si amenable to trying [employer] and [employer] instead.  Medications reviewed and include: folic  acid 1 mg daily, Protonix, thiamine 100 mg daily, warfarin.  Labs reviewed: Potassium 3.4.  I/O: 800 mL UOP yesterday (0.5 mL/kg/hr)  No weight to trend since 12/31. Patient unsure of weight history. Per chart patient was 78.9 kg on 12/20/2016. Currently documented to be 73.1 kg (161.16 lbs).  Discussed with RN.  NUTRITION - FOCUSED PHYSICAL EXAM:  Flowsheet Row Most Recent Value  Orbital Region Moderate depletion  Upper Arm Region Severe depletion  Thoracic and Lumbar Region Moderate depletion  Buccal Region Moderate depletion  Temple Region Severe depletion  Clavicle Bone Region Severe depletion  Clavicle and Acromion Bone Region Moderate depletion  Scapular Bone Region Moderate depletion  Dorsal Hand Severe depletion  Patellar Region Moderate depletion  Anterior Thigh Region Moderate depletion  Posterior Calf Region Severe depletion  Edema (RD Assessment) --  [non-pitting]  Hair Reviewed  Eyes Reviewed  Mouth Reviewed  Skin Reviewed  Nails Reviewed     Diet Order:   Diet Order            Diet regular Room service appropriate? Yes; Fluid consistency: Thin  Diet effective now                EDUCATION NEEDS:   No education needs have been identified at this time  Skin:  Skin Assessment: Skin Integrity Issues: Skin Integrity Issues:: Incisions Incisions: closed incision left hip  Last BM:  10/07/2019 type 7  Height:   Ht Readings from Last 1 Encounters:  10/01/20 '5\' 11"'  (1.803 m)   Weight:   Wt Readings from Last 1 Encounters:  10/01/20 73.1 kg   Ideal Body Weight:  78 kg  BMI:  Body mass index is 22.48 kg/m.  Estimated Nutritional Needs:   Kcal:  1900-2200kcal/day  Protein:  95-110g/day  Fluid:  1.9-2.1L/day  Jacklynn Barnacle, MS, RD, LDN Pager number available on Amion

## 2020-10-08 NOTE — Progress Notes (Signed)
PT Cancellation Note  Patient Details Name: Kenneth Mccall MRN: [id number] DOB: 1956-08-23   Cancelled Treatment:     Pt still getting transfusion. Plan to Dc to rehab afterwards.   Willette Pa 10/08/2020, 4:21 PM

## 2020-10-08 NOTE — TOC Transition Note (Addendum)
Transition of Care [facility]) - CM/SW Discharge Note   Patient Details  Name: Kenneth Mccall MRN: [id number] Date of Birth: 02/15/1956  [facility] CM/SW Contact:  Magnus Ivan, LCSW Phone Number: 10/08/2020, 4:31 PM   Clinical Narrative:   Patient to discharge to [facility] today, Room 809. Confirmed with Gerald Stabs and Tammy at [facility]. CSW updated MD, RN. Patient/family aware. Asked RN to call report and MD to submit DC Summary. Medical Necessity Form and Face Sheet placed in Discharge Packet by patient chart. RN to call EMS when patient is medically ready for transport to [facility].     Final next level of care: Skilled Nursing Facility Barriers to Discharge: Barriers Resolved   Patient Goals and CMS Choice Patient states their goals for this hospitalization and ongoing recovery are:: SNF rehab CMS Medicare.gov Compare Post Acute Care list provided to:: Patient Choice offered to / list presented to : [place]  Discharge Placement              Patient chooses bed at: [facility] [place] Patient to be transferred to facility by: EMS Name of family member notified: patient and family aware. Patient and family notified of of transfer: 10/08/20  Discharge Plan and Services   Discharge Planning Services: NA Post Acute Care Choice: Home Health          DME Arranged: N/A DME Agency: NA       HH Arranged: NA HH Agency: NA        Social Determinants of Health (SDOH) Interventions     Readmission Risk Interventions No flowsheet data found.

## 2020-10-08 NOTE — Progress Notes (Signed)
Report called to Legrand Como at [facility]. IV remove. Awaiting EMS transport at this time.
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
## 2020-10-08 NOTE — Progress Notes (Signed)
PT  Note  Patient Details Name: Kenneth Mccall MRN: [id number] DOB: [birth year]   Cancelled Treatment:     PT attempt, PT hold. Transfusion ordered due to severe drop in Hgb from previous date. Will see after blood transfusion.    Willette Pa 10/08/2020, 10:57 AM
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
## 2020-10-09 LAB — TYPE AND SCREEN
ABO/RH(D): O POS
Antibody Screen: NEGATIVE
Unit division: 0

## 2020-10-09 LAB — BPAM RBC
Blood Product Expiration Date: 202202042359
ISSUE DATE / TIME: 202201071337
Unit Type and Rh: 5100

## 2020-10-25 ENCOUNTER — Other Ambulatory Visit: Payer: Self-pay

## 2020-10-25 ENCOUNTER — Emergency Department
Admission: EM | Admit: 2020-10-25 | Discharge: 2020-10-25 | Disposition: A | Discharge status: Home / Self Care | Payer: Medicare Other | Attending: Emergency Medicine | Admitting: Emergency Medicine

## 2020-10-25 ENCOUNTER — Emergency Department: Payer: Medicare Other

## 2020-10-25 ENCOUNTER — Encounter: Payer: Self-pay | Admitting: Emergency Medicine

## 2020-10-25 DIAGNOSIS — I10 Essential (primary) hypertension: Secondary | ICD-10-CM | POA: Insufficient documentation

## 2020-10-25 DIAGNOSIS — Z86011 Personal history of benign neoplasm of the brain: Secondary | ICD-10-CM | POA: Diagnosis not present

## 2020-10-25 DIAGNOSIS — Z79899 Other long term (current) drug therapy: Secondary | ICD-10-CM | POA: Insufficient documentation

## 2020-10-25 DIAGNOSIS — M25552 Pain in left hip: Secondary | ICD-10-CM | POA: Diagnosis present

## 2020-10-25 DIAGNOSIS — R52 Pain, unspecified: Secondary | ICD-10-CM

## 2020-10-25 DIAGNOSIS — Z7901 Long term (current) use of anticoagulants: Secondary | ICD-10-CM | POA: Diagnosis not present

## 2020-10-25 MED ORDER — OXYCODONE HCL 15 MG PO TABS: 15.0000 mg | ORAL_TABLET | Freq: Four times a day (QID) | ORAL | 0 refills | Status: DC | PRN

## 2020-10-25 MED ORDER — HYDROMORPHONE HCL 1 MG/ML IJ SOLN
1.0000 mg | Freq: Once | INTRAMUSCULAR | Status: AC
Start: 2020-10-25 — End: 2020-10-25
  Administered 2020-10-25: 1 mg via INTRAMUSCULAR
  Filled 2020-10-25: qty 1

## 2020-10-25 NOTE — ED Notes (Signed)
Pt given a cup of ice water

## 2020-10-25 NOTE — ED Triage Notes (Signed)
Pt to ED via [facility] from home for Left hip pain. Pt was DC from [facility] yesterday. Pt states that he was in the bed and moved, when he moved he started having severe pain. Pt states that he did take a pain pill, Pt states that it was either OxyContin or Oxycodone 15 mg (pt states that it is a little green pill) around 0130. Pain eased off but it is now hurting again. Pt is in NAD.
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
## 2020-10-25 NOTE — ED Notes (Signed)
Patient verbalizes understanding of discharge instructions. Opportunity for questioning and answers were provided. Armband removed by staff, pt discharged from ED.  

## 2020-10-25 NOTE — ED Notes (Signed)
Pt ambulated w/walker, standby assist. Was able to transfer to [facility] by self. Wheeled to lobby to wait on ride

## 2020-10-25 NOTE — Discharge Instructions (Signed)
Your x-ray showed no acute abnormality.  I have refilled your pain medication for 3 days.  This is not a medication that can be refilled chronically from the emergency department and you will need to follow-up with your orthopedic physician.

## 2020-10-25 NOTE — ED Notes (Signed)
First Nurse Note: Pt to ED via [facility] from home for Lt hip pain. Pt had hip surgery on 12/31 s/p hip fracture. Pt was DC from [facility] yesterday at 1 pm. BP 196/113. Pt in NAD.

## 2020-10-25 NOTE — ED Notes (Signed)
Pt calling sister for ride home.  

## 2020-11-14 ENCOUNTER — Emergency Department: Payer: Medicare Other

## 2020-11-14 ENCOUNTER — Other Ambulatory Visit: Payer: Self-pay

## 2020-11-14 ENCOUNTER — Inpatient Hospital Stay
Admission: EM | Admit: 2020-11-14 | Discharge: 2020-11-18 | DRG: 555 | Disposition: A | Discharge status: Skilled Nursing Facility | Payer: Medicare Other | Attending: Internal Medicine | Admitting: Internal Medicine

## 2020-11-14 DIAGNOSIS — F419 Anxiety disorder, unspecified: Secondary | ICD-10-CM | POA: Diagnosis present

## 2020-11-14 DIAGNOSIS — S72002A Fracture of unspecified part of neck of left femur, initial encounter for closed fracture: Secondary | ICD-10-CM

## 2020-11-14 DIAGNOSIS — E43 Unspecified severe protein-calorie malnutrition: Secondary | ICD-10-CM | POA: Insufficient documentation

## 2020-11-14 DIAGNOSIS — T402X5A Adverse effect of other opioids, initial encounter: Secondary | ICD-10-CM

## 2020-11-14 DIAGNOSIS — B1920 Unspecified viral hepatitis C without hepatic coma: Secondary | ICD-10-CM | POA: Diagnosis present

## 2020-11-14 DIAGNOSIS — K219 Gastro-esophageal reflux disease without esophagitis: Secondary | ICD-10-CM | POA: Diagnosis present

## 2020-11-14 DIAGNOSIS — S72009A Fracture of unspecified part of neck of unspecified femur, initial encounter for closed fracture: Secondary | ICD-10-CM | POA: Diagnosis not present

## 2020-11-14 DIAGNOSIS — S72002S Fracture of unspecified part of neck of left femur, sequela: Secondary | ICD-10-CM

## 2020-11-14 DIAGNOSIS — W19XXXS Unspecified fall, sequela: Secondary | ICD-10-CM | POA: Diagnosis present

## 2020-11-14 DIAGNOSIS — I10 Essential (primary) hypertension: Secondary | ICD-10-CM | POA: Diagnosis not present

## 2020-11-14 DIAGNOSIS — K5903 Drug induced constipation: Secondary | ICD-10-CM | POA: Diagnosis not present

## 2020-11-14 DIAGNOSIS — F1010 Alcohol abuse, uncomplicated: Secondary | ICD-10-CM | POA: Diagnosis present

## 2020-11-14 DIAGNOSIS — G4700 Insomnia, unspecified: Secondary | ICD-10-CM | POA: Diagnosis present

## 2020-11-14 DIAGNOSIS — M25552 Pain in left hip: Secondary | ICD-10-CM | POA: Diagnosis not present

## 2020-11-14 DIAGNOSIS — F0390 Unspecified dementia without behavioral disturbance: Secondary | ICD-10-CM | POA: Diagnosis present

## 2020-11-14 DIAGNOSIS — W010XXA Fall on same level from slipping, tripping and stumbling without subsequent striking against object, initial encounter: Secondary | ICD-10-CM | POA: Diagnosis present

## 2020-11-14 DIAGNOSIS — W19XXXA Unspecified fall, initial encounter: Secondary | ICD-10-CM

## 2020-11-14 DIAGNOSIS — Z7901 Long term (current) use of anticoagulants: Secondary | ICD-10-CM

## 2020-11-14 DIAGNOSIS — Z20822 Contact with and (suspected) exposure to covid-19: Secondary | ICD-10-CM | POA: Diagnosis present

## 2020-11-14 DIAGNOSIS — Z952 Presence of prosthetic heart valve: Secondary | ICD-10-CM

## 2020-11-14 DIAGNOSIS — Z79899 Other long term (current) drug therapy: Secondary | ICD-10-CM

## 2020-11-14 DIAGNOSIS — R296 Repeated falls: Secondary | ICD-10-CM | POA: Diagnosis present

## 2020-11-14 DIAGNOSIS — W19XXXD Unspecified fall, subsequent encounter: Secondary | ICD-10-CM | POA: Diagnosis present

## 2020-11-14 DIAGNOSIS — Z5320 Procedure and treatment not carried out because of patient's decision for unspecified reasons: Secondary | ICD-10-CM | POA: Diagnosis not present

## 2020-11-14 DIAGNOSIS — S72142D Displaced intertrochanteric fracture of left femur, subsequent encounter for closed fracture with routine healing: Secondary | ICD-10-CM

## 2020-11-14 DIAGNOSIS — Z86011 Personal history of benign neoplasm of the brain: Secondary | ICD-10-CM

## 2020-11-14 DIAGNOSIS — Z6822 Body mass index (BMI) 22.0-22.9, adult: Secondary | ICD-10-CM

## 2020-11-14 LAB — CBC WITH DIFFERENTIAL/PLATELET
Abs Immature Granulocytes: 0.04 10*3/uL (ref 0.00–0.07)
Basophils Absolute: 0.1 10*3/uL (ref 0.0–0.1)
Basophils Relative: 1 %
Eosinophils Absolute: 0.1 10*3/uL (ref 0.0–0.5)
Eosinophils Relative: 1 %
HCT: 34.9 % — ABNORMAL LOW (ref 39.0–52.0)
Hemoglobin: 10.8 g/dL — ABNORMAL LOW (ref 13.0–17.0)
Immature Granulocytes: 1 %
Lymphocytes Relative: 21 %
Lymphs Abs: 1.8 10*3/uL (ref 0.7–4.0)
MCH: 25.5 pg — ABNORMAL LOW (ref 26.0–34.0)
MCHC: 30.9 g/dL (ref 30.0–36.0)
MCV: 82.5 fL (ref 80.0–100.0)
Monocytes Absolute: 0.9 10*3/uL (ref 0.1–1.0)
Monocytes Relative: 10 %
Neutro Abs: 5.5 10*3/uL (ref 1.7–7.7)
Neutrophils Relative %: 66 %
Platelets: 187 10*3/uL (ref 150–400)
RBC: 4.23 MIL/uL (ref 4.22–5.81)
RDW: 15.9 % — ABNORMAL HIGH (ref 11.5–15.5)
WBC: 8.3 10*3/uL (ref 4.0–10.5)
nRBC: 0 % (ref 0.0–0.2)

## 2020-11-14 LAB — BASIC METABOLIC PANEL
Anion gap: 9 (ref 5–15)
BUN: 10 mg/dL (ref 8–23)
CO2: 27 mmol/L (ref 22–32)
Calcium: 9 mg/dL (ref 8.9–10.3)
Chloride: 101 mmol/L (ref 98–111)
Creatinine, Ser: 0.77 mg/dL (ref 0.61–1.24)
GFR, Estimated: >60 mL/min (ref >60)
Glucose, Bld: 103 mg/dL — ABNORMAL HIGH (ref 70–99)
Potassium: 4.9 mmol/L (ref 3.5–5.1)
Sodium: 137 mmol/L (ref 135–145)

## 2020-11-14 LAB — COMPREHENSIVE METABOLIC PANEL
ALT: 7 U/L (ref 0–44)
AST: 18 U/L (ref 15–41)
Albumin: 3.4 g/dL — ABNORMAL LOW (ref 3.5–5.0)
Alkaline Phosphatase: 117 U/L (ref 38–126)
Anion gap: 7 (ref 5–15)
BUN: 10 mg/dL (ref 8–23)
CO2: 29 mmol/L (ref 22–32)
Calcium: 9 mg/dL (ref 8.9–10.3)
Chloride: 99 mmol/L (ref 98–111)
Creatinine, Ser: 0.73 mg/dL (ref 0.61–1.24)
GFR, Estimated: >60 mL/min (ref >60)
Glucose, Bld: 122 mg/dL — ABNORMAL HIGH (ref 70–99)
Potassium: 4.3 mmol/L (ref 3.5–5.1)
Sodium: 135 mmol/L (ref 135–145)
Total Bilirubin: 0.7 mg/dL (ref 0.3–1.2)
Total Protein: 7.2 g/dL (ref 6.5–8.1)

## 2020-11-14 LAB — PROTIME-INR
INR: 1.5 — ABNORMAL HIGH (ref 0.8–1.2)
Prothrombin Time: 17.9 seconds — ABNORMAL HIGH (ref 11.4–15.2)

## 2020-11-14 LAB — MAGNESIUM
Magnesium: 1.7 mg/dL (ref 1.7–2.4)
Magnesium: 1.7 mg/dL (ref 1.7–2.4)

## 2020-11-14 LAB — PHOSPHORUS: Phosphorus: 4.1 mg/dL (ref 2.5–4.6)

## 2020-11-14 MED ORDER — TRAMADOL HCL 50 MG PO TABS
50.0000 mg | ORAL_TABLET | Freq: Four times a day (QID) | ORAL | Status: DC | PRN
  Administered 2020-11-14 – 2020-11-17 (×4): 50 mg via ORAL
  Filled 2020-11-14 (×4): qty 1

## 2020-11-14 MED ORDER — LORAZEPAM 1 MG PO TABS: 1.0000 mg | ORAL_TABLET | ORAL | Status: DC | PRN

## 2020-11-14 MED ORDER — THIAMINE HCL 100 MG PO TABS
100.0000 mg | ORAL_TABLET | Freq: Every day | ORAL | Status: DC
  Administered 2020-11-15 – 2020-11-18 (×4): 100 mg via ORAL
  Filled 2020-11-14 (×4): qty 1

## 2020-11-14 MED ORDER — THIAMINE HCL 100 MG/ML IJ SOLN: 100.0000 mg | Freq: Every day | INTRAMUSCULAR | Status: DC

## 2020-11-14 MED ORDER — LORAZEPAM 2 MG/ML IJ SOLN: 1.0000 mg | INTRAMUSCULAR | Status: DC | PRN

## 2020-11-14 MED ORDER — MORPHINE SULFATE (PF) 4 MG/ML IV SOLN
4.0000 mg | Freq: Once | INTRAVENOUS | Status: AC
  Administered 2020-11-14: 4 mg via INTRAVENOUS
  Filled 2020-11-14: qty 1

## 2020-11-14 MED ORDER — THIAMINE HCL 100 MG PO TABS: 100.0000 mg | ORAL_TABLET | Freq: Every day | ORAL | Status: DC

## 2020-11-14 MED ORDER — MORPHINE SULFATE (PF) 2 MG/ML IV SOLN: 2.0000 mg | INTRAVENOUS | Status: DC | PRN

## 2020-11-14 MED ORDER — TRAZODONE HCL 50 MG PO TABS
50.0000 mg | ORAL_TABLET | Freq: Every evening | ORAL | Status: DC | PRN
  Administered 2020-11-14 – 2020-11-17 (×3): 50 mg via ORAL
  Filled 2020-11-14 (×3): qty 1

## 2020-11-14 MED ORDER — ACETAMINOPHEN 325 MG PO TABS: 325.0000 mg | ORAL_TABLET | Freq: Four times a day (QID) | ORAL | Status: DC | PRN

## 2020-11-14 MED ORDER — METOPROLOL SUCCINATE ER 50 MG PO TB24
100.0000 mg | ORAL_TABLET | Freq: Every day | ORAL | Status: DC
  Administered 2020-11-15 – 2020-11-18 (×4): 100 mg via ORAL
  Filled 2020-11-14 (×4): qty 2

## 2020-11-14 MED ORDER — ALBUTEROL SULFATE HFA 108 (90 BASE) MCG/ACT IN AERS
2.0000 | INHALATION_SPRAY | Freq: Four times a day (QID) | RESPIRATORY_TRACT | Status: DC | PRN
  Filled 2020-11-14: qty 6.7

## 2020-11-14 MED ORDER — OXYCODONE-ACETAMINOPHEN 5-325 MG PO TABS
1.0000 | ORAL_TABLET | Freq: Once | ORAL | Status: AC
Start: 2020-11-14 — End: 2020-11-14
  Administered 2020-11-14: 1 via ORAL
  Filled 2020-11-14: qty 1

## 2020-11-14 MED ORDER — MORPHINE SULFATE (PF) 2 MG/ML IV SOLN: 1.0000 mg | INTRAVENOUS | Status: DC | PRN

## 2020-11-14 MED ORDER — POLYETHYLENE GLYCOL 3350 17 G PO PACK: 17.0000 g | PACK | Freq: Every day | ORAL | Status: DC | PRN

## 2020-11-14 MED ORDER — GABAPENTIN 300 MG PO CAPS
300.0000 mg | ORAL_CAPSULE | Freq: Three times a day (TID) | ORAL | Status: DC
  Administered 2020-11-15 – 2020-11-18 (×8): 300 mg via ORAL
  Filled 2020-11-14 (×9): qty 1

## 2020-11-14 MED ORDER — WARFARIN - PHARMACIST DOSING INPATIENT
Freq: Every day | Status: DC
  Filled 2020-11-14: qty 1

## 2020-11-14 MED ORDER — OXYCODONE HCL 5 MG PO TABS
15.0000 mg | ORAL_TABLET | Freq: Four times a day (QID) | ORAL | Status: AC | PRN
  Administered 2020-11-15 – 2020-11-17 (×3): 15 mg via ORAL
  Filled 2020-11-14 (×4): qty 3

## 2020-11-14 MED ORDER — ENOXAPARIN SODIUM 60 MG/0.6ML ~~LOC~~ SOLN: 60.0000 mg | Freq: Two times a day (BID) | SUBCUTANEOUS | Status: DC

## 2020-11-14 MED ORDER — POLYETHYLENE GLYCOL 3350 17 G PO PACK
17.0000 g | PACK | Freq: Every day | ORAL | Status: DC
  Administered 2020-11-17: 17 g via ORAL
  Filled 2020-11-14 (×4): qty 1

## 2020-11-14 MED ORDER — FOLIC ACID 1 MG PO TABS: 1.0000 mg | ORAL_TABLET | Freq: Every day | ORAL | Status: DC

## 2020-11-14 MED ORDER — PANTOPRAZOLE SODIUM 40 MG PO TBEC
40.0000 mg | DELAYED_RELEASE_TABLET | Freq: Two times a day (BID) | ORAL | Status: DC
  Administered 2020-11-14 – 2020-11-18 (×7): 40 mg via ORAL
  Filled 2020-11-14 (×7): qty 1

## 2020-11-14 MED ORDER — ADULT MULTIVITAMIN W/MINERALS CH: 1.0000 | ORAL_TABLET | Freq: Every day | ORAL | Status: DC

## 2020-11-14 MED ORDER — ACETAMINOPHEN 325 MG PO TABS
325.0000 mg | ORAL_TABLET | Freq: Four times a day (QID) | ORAL | Status: AC | PRN
  Administered 2020-11-17: 325 mg via ORAL
  Filled 2020-11-14: qty 1

## 2020-11-14 MED ORDER — MAGNESIUM OXIDE 400 (241.3 MG) MG PO TABS
800.0000 mg | ORAL_TABLET | Freq: Three times a day (TID) | ORAL | Status: DC
  Administered 2020-11-15 – 2020-11-18 (×8): 800 mg via ORAL
  Filled 2020-11-14 (×9): qty 2

## 2020-11-14 MED ORDER — ADULT MULTIVITAMIN W/MINERALS CH
1.0000 | ORAL_TABLET | Freq: Every day | ORAL | Status: DC
  Administered 2020-11-15 – 2020-11-18 (×4): 1 via ORAL
  Filled 2020-11-14 (×4): qty 1

## 2020-11-14 MED ORDER — WARFARIN SODIUM 5 MG PO TABS: 5.0000 mg | ORAL_TABLET | Freq: Every day | ORAL | Status: DC

## 2020-11-14 MED ORDER — METOPROLOL TARTRATE 5 MG/5ML IV SOLN: 5.0000 mg | INTRAVENOUS | Status: DC

## 2020-11-14 MED ORDER — WARFARIN SODIUM 5 MG PO TABS
5.0000 mg | ORAL_TABLET | Freq: Once | ORAL | Status: AC
  Administered 2020-11-14: 5 mg via ORAL
  Filled 2020-11-14: qty 1

## 2020-11-14 MED ORDER — FOLIC ACID 1 MG PO TABS
1.0000 mg | ORAL_TABLET | Freq: Every day | ORAL | Status: DC
  Administered 2020-11-15 – 2020-11-18 (×4): 1 mg via ORAL
  Filled 2020-11-14 (×4): qty 1

## 2020-11-14 MED ORDER — LOSARTAN POTASSIUM 50 MG PO TABS
100.0000 mg | ORAL_TABLET | Freq: Every day | ORAL | Status: DC
  Administered 2020-11-15 – 2020-11-18 (×3): 100 mg via ORAL
  Filled 2020-11-14 (×4): qty 2
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
## 2020-11-14 NOTE — ED Notes (Signed)
Pharmacy contacted to verify meds

## 2020-11-14 NOTE — ED Notes (Signed)
Pt taken to CT.

## 2020-11-14 NOTE — ED Notes (Addendum)
Pt placed on bed alarm, yellow bracelet and yellow socks in place. Pt oriented to person and place. States "the people who won the ball game are judging me for my pain medications"

## 2020-11-14 NOTE — ED Notes (Signed)
Pharmacy contacted for missing meds

## 2020-11-14 NOTE — ED Notes (Signed)
This RN to bedside to try to ambulate pt to assess discharge ability. Pt and sister at bedside report that pt has had multiple falls since being d/c from nursing home, pt has been staying at house with sister and reports sister has had to pick pt up from bed and push pt on walker around house. Sister states pt needs to go to rehab before coming home.  Pt states he cannot walk d/t pain in left hip

## 2020-11-14 NOTE — ED Notes (Signed)
Pt in CT.
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
## 2020-11-14 NOTE — ED Notes (Signed)
Resent blue and green tubes to lab at this time.
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
## 2020-11-14 NOTE — ED Notes (Signed)
Pt provided sandwich tray and ice water

## 2020-11-15 DIAGNOSIS — E43 Unspecified severe protein-calorie malnutrition: Secondary | ICD-10-CM

## 2020-11-15 DIAGNOSIS — I10 Essential (primary) hypertension: Secondary | ICD-10-CM | POA: Diagnosis not present

## 2020-11-15 DIAGNOSIS — W19XXXA Unspecified fall, initial encounter: Secondary | ICD-10-CM

## 2020-11-15 DIAGNOSIS — Z952 Presence of prosthetic heart valve: Secondary | ICD-10-CM | POA: Diagnosis not present

## 2020-11-15 LAB — VITAMIN D 25 HYDROXY (VIT D DEFICIENCY, FRACTURES): Vit D, 25-Hydroxy: 20.51 ng/mL — ABNORMAL LOW (ref 30–100)

## 2020-11-15 LAB — PROTIME-INR
INR: 1.4 — ABNORMAL HIGH (ref 0.8–1.2)
Prothrombin Time: 16.7 seconds — ABNORMAL HIGH (ref 11.4–15.2)

## 2020-11-15 LAB — SARS CORONAVIRUS 2 (TAT 6-24 HRS): SARS Coronavirus 2: NEGATIVE

## 2020-11-15 MED ORDER — WARFARIN SODIUM 7.5 MG PO TABS
7.5000 mg | ORAL_TABLET | Freq: Once | ORAL | Status: AC
  Administered 2020-11-15: 7.5 mg via ORAL
  Filled 2020-11-15: qty 1

## 2020-11-15 MED ORDER — BOOST / RESOURCE BREEZE PO LIQD CUSTOM
1.0000 | Freq: Three times a day (TID) | ORAL | Status: DC
  Administered 2020-11-16 – 2020-11-18 (×5): 1 via ORAL

## 2020-11-15 MED ORDER — ENOXAPARIN SODIUM 80 MG/0.8ML ~~LOC~~ SOLN
1.0000 mg/kg | Freq: Two times a day (BID) | SUBCUTANEOUS | Status: DC
  Administered 2020-11-15 – 2020-11-18 (×6): 75 mg via SUBCUTANEOUS
  Filled 2020-11-15 (×9): qty 0.8

## 2020-11-15 MED ORDER — LORAZEPAM 2 MG/ML IJ SOLN
1.0000 mg | Freq: Once | INTRAMUSCULAR | Status: AC
  Administered 2020-11-16: 1 mg via INTRAVENOUS
  Filled 2020-11-15: qty 1

## 2020-11-15 MED ORDER — MORPHINE SULFATE (PF) 2 MG/ML IV SOLN: 2.0000 mg | INTRAVENOUS | Status: DC | PRN

## 2020-11-15 MED ORDER — VITAMIN D 25 MCG (1000 UNIT) PO TABS
1000.0000 [IU] | ORAL_TABLET | Freq: Every day | ORAL | Status: DC
  Administered 2020-11-15 – 2020-11-18 (×4): 1000 [IU] via ORAL
  Filled 2020-11-15 (×4): qty 1

## 2020-11-15 NOTE — ED Notes (Signed)
Pt voided using urinal. Pt repositioned and covered by this tech.

## 2020-11-15 NOTE — Evaluation (Signed)
Physical Therapy Evaluation Patient Details Name: Kenneth Mccall MRN: [id number] DOB: [birth year] Today's Date: 11/15/2020   History of Present Illness  Per MD notes: Pt is a 65 y.o. male with medical history significant for hypertension, anxiety, history of alcohol abuse, history of aortic dissection status post repair, aortic valve repair in 2012 now with mechanical valve, frequent falls and altered mental status previously attributed to multifactorial including possible Warnicke's, history of alcohol and marijuana use, muscle weakness, and possible dementia, history of subdural hematoma, atrial flutter, meningioma, horseshoe kidney, and hepatitis C presented to the ED for chief concerns of fall.  Per ortho note regarding CT of left proximal femoral shaft: "There does not appear to be a new fracture and is more likely from prior fracture.  Additionally, if it was a new fracture, the bone is appropriately supported with IM nail and no further surgical intervention could improve this, recommend continued weightbearing as tolerated on the left lower extremity".    Clinical Impression  Pt was pleasant and motivated to participate during the session.  Pt required min-mod A with bed mobility tasks and min A to stand from an elevated EOB.  Pt was able to lift his L foot from the floor while supported by a HW but could not lift his R foot up.  Pt was able to take several small steps at the EOB that were a combination of hop-to and shuffle gait patterns, essentially limiting LLE SLS time as much as possible.  Pt will benefit from PT services in a SNF setting upon discharge to safely address deficits listed in patient problem list for decreased caregiver assistance and eventual return to PLOF.      Follow Up Recommendations SNF    Equipment Recommendations  Other (comment) (TBD at next venue of care)    Recommendations for Other Services       Precautions / Restrictions Precautions Precautions:  Fall Restrictions Weight Bearing Restrictions: Yes LLE Weight Bearing: Weight bearing as tolerated      Mobility  Bed Mobility Overal bed mobility: Needs Assistance Bed Mobility: Rolling;Sidelying to Sit;Sit to Sidelying Rolling: Min assist Sidelying to sit: Mod assist     Sit to sidelying: Mod assist General bed mobility comments: Log roll training to the R with pillow between the knees for pain control and decreased caregiver assistance    Transfers Overall transfer level: Needs assistance Equipment used: Hemi-walker Transfers: Sit to/from Stand Sit to Stand: Min assist;From elevated surface         General transfer comment: Min A to stand and for stability upon initial stand  Ambulation/Gait Ambulation/Gait assistance: Min guard Gait Distance (Feet): 2 Feet Assistive device: Hemi-walker Gait Pattern/deviations: Step-to pattern;Antalgic;Decreased stance time - left;Trunk flexed;Shuffle Gait velocity: decreased   General Gait Details: Pt with significantly decreased LLE stance time ambulating with almost a hop-to/shuffle gait pattern and only able to tolerance several small steps before returning to sitting  Stairs            Wheelchair Mobility    Modified Rankin (Stroke Patients Only)       Balance Overall balance assessment: Needs assistance Sitting-balance support: Feet supported;Single extremity supported Sitting balance-Leahy Scale: Good     Standing balance support: Single extremity supported Standing balance-Leahy Scale: Fair Standing balance comment: Heavy lean on the [facility] for support                             Pertinent Vitals/Pain  Pain Assessment: 0-10 Pain Score: 5  Pain Location: L hip Pain Descriptors / Indicators: Aching;Sore Pain Intervention(s): Monitored during session;Premedicated before session    [place] expects to be discharged to:: Private residence Living Arrangements: Alone Available Help  at Discharge: Personal care attendant;Available PRN/intermittently;Family Type of Home: Mobile home Home Access: Ramped entrance     Home Layout: One level Home Equipment: [place] - 4 wheels;Shower seat;Cane - single point;Cane - quad;Wheelchair - manual;Other (comment) Additional Comments: hemiwalker    Prior Function Level of Independence: Independent with assistive device(s)         Comments: Mod ind amb in the home with a HW, rollator in the community, history of multiple falls;  PCA 1-2x/week for IADLs     Hand Dominance   Dominant Hand: Left    Extremity/Trunk Assessment   Upper Extremity Assessment Upper Extremity Assessment: Generalized weakness;RUE deficits/detail RUE Deficits / Details: 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    Lower Extremity Assessment Lower Extremity Assessment: Generalized weakness;LLE deficits/detail LLE: Unable to fully assess due to pain       Communication   Communication: No difficulties  Cognition Arousal/Alertness: Awake/alert Behavior During Therapy: WFL for tasks assessed/performed Overall Cognitive Status: No family/caregiver present to determine baseline cognitive functioning                                 General Comments: Pt able to provide detailed history and follow commnads well      General Comments      Exercises Total Joint Exercises Ankle Circles/Pumps: AROM;Strengthening;Both;10 reps Quad Sets: Strengthening;Both;10 reps Gluteal Sets: 10 reps;Both;Strengthening Heel Slides: AAROM;Both;10 reps;AROM (AAROM on the LLE) Hip ABduction/ADduction: AROM;AAROM;Both;10 reps (AAROM on the LLE) Straight Leg Raises: AROM;AAROM;Both;5 reps (AAROM on the LLE) Long Arc Quad: AROM;Strengthening;Both;10 reps;15 reps Knee Flexion: AROM;Strengthening;Both;15 reps;10 reps Marching in Standing: AROM;Left;Standing;5 reps Other Exercises Other Exercises: HEP education/review for BLE APs, QS, and GS x 10 each every 1-2 hours  daily as tolerated   Assessment/Plan    PT Assessment Patient needs continued PT services  PT Problem List Decreased strength;Decreased activity tolerance;Decreased balance;Decreased mobility;Decreased knowledge of use of DME;Pain       PT Treatment Interventions DME instruction;Gait training;Functional mobility training;Therapeutic activities;Therapeutic exercise;Balance training;Patient/family education    PT Goals (Current goals can be found in the Care Plan section)  Acute Rehab PT Goals Patient Stated Goal: To walk without pain PT Goal Formulation: With patient Time For Goal Achievement: 11/28/20 Potential to Achieve Goals: Good    Frequency Min 2X/week   Barriers to discharge Inaccessible home environment;Decreased caregiver support      Co-evaluation               AM-PAC PT "6 Clicks" Mobility  Outcome Measure Help needed turning from your back to your side while in a flat bed without using bedrails?: A Little Help needed moving from lying on your back to sitting on the side of a flat bed without using bedrails?: A Lot Help needed moving to and from a bed to a chair (including a wheelchair)?: A Lot Help needed standing up from a chair using your arms (e.g., wheelchair or bedside chair)?: A Little Help needed to walk in hospital room?: Total Help needed climbing 3-5 steps with a railing? : Total 6 Click Score: 12    End of Session Equipment Utilized During Treatment: Gait belt Activity Tolerance: No increased pain Patient left: in bed;with  call bell/phone within reach;with bed alarm set Nurse Communication: Mobility status PT Visit Diagnosis: Unsteadiness on feet (R26.81);History of falling (Z91.81);Repeated falls (R29.6);Difficulty in walking, not elsewhere classified (R26.2);Muscle weakness (generalized) (M62.81);Pain Pain - Right/Left: Left Pain - part of body: Hip    Time: 1470-9295 PT Time Calculation (min) (ACUTE ONLY): 36 min   Charges:   PT  Evaluation $PT Eval Moderate Complexity: 1 Mod PT Treatments $Therapeutic Exercise: 8-22 mins        D. Royetta Asal PT, DPT 11/15/20, 1:53 PM

## 2020-11-15 NOTE — Progress Notes (Signed)
[facility] [place] NAME: Kenneth Mccall    MR#:  [id number]  DATE OF BIRTH:  [birth year]  SUBJECTIVE:   Patient was just discharge from rehab about three weeks ago has been staying with his sister getting home PT fell while tripped over the chair when he was at a party. Sister in the room. Patient overall improving. Seen by physical therapy earlier. REVIEW OF SYSTEMS:   Review of Systems  Constitutional: Negative for chills, fever and weight loss.  HENT: Negative for ear discharge, ear pain and nosebleeds.   Eyes: Negative for blurred vision, pain and discharge.  Respiratory: Negative for sputum production, shortness of breath, wheezing and stridor.   Cardiovascular: Negative for chest pain, palpitations, orthopnea and PND.  Gastrointestinal: Negative for abdominal pain, diarrhea, nausea and vomiting.  Genitourinary: Negative for frequency and urgency.  Musculoskeletal: Positive for falls and joint pain. Negative for back pain.  Neurological: Positive for weakness. Negative for sensory change, speech change and focal weakness.  Psychiatric/Behavioral: Negative for depression and hallucinations. The patient is not nervous/anxious.    Tolerating Diet:yes Tolerating PT:   DRUG ALLERGIES:   Allergies  Allergen Reactions  . Anectine [Succinylcholine Chloride] Anaphylaxis  . Atenolol Other (See Comments)    Dry mouth  . Enalapril Other (See Comments)    cough    VITALS:  Blood pressure (!) 142/70, pulse 76, temperature 98.2 F (36.8 C), temperature source Oral, resp. rate 16, height 6' (1.829 m), weight 75 kg, SpO2 100 %.  PHYSICAL EXAMINATION:   Physical Exam  GENERAL:  65 y.o.-year-old patient lying in the bed with no acute distress.  Thin, fraile CARDIOVASCULAR: S1, S2 normal. No murmurs, rubs, or gallops.  ABDOMEN: Soft, nontender, nondistended. Bowel sounds present. No organomegaly or mass.  EXTREMITIES: No cyanosis, clubbing  or edema b/l.    NEUROLOGIC: Cranial nerves II through XII are intact. No focal Motor or sensory deficits b/l.   PSYCHIATRIC:  patient is alert and oriented x 3.  SKIN: No obvious rash, lesion, or ulcer.   LABORATORY PANEL:  CBC Recent Labs  Lab 11/14/20 1721  WBC 8.3  HGB 10.8*  HCT 34.9*  PLT 187    Chemistries  Recent Labs  Lab 11/14/20 2156  NA 135  K 4.3  CL 99  CO2 29  GLUCOSE 122*  BUN 10  CREATININE 0.73  CALCIUM 9.0  MG 1.7  AST 18  ALT 7  ALKPHOS 117  BILITOT 0.7   Cardiac Enzymes No results for input(s): TROPONINI in the last 168 hours. RADIOLOGY:  DG Pelvis 1-2 Views  Result Date: 11/14/2020 CLINICAL DATA:  Recent fall. EXAM: LEFT FEMUR 2 VIEWS; PELVIS - 1-2 VIEW COMPARISON:  October 01, 2020 FINDINGS: The patient has undergone recent intramedullary nail placement through the proximal left femur. The hardware appears intact. There is evidence for a healing intratrochanteric fracture of the proximal left femur. Degenerative changes are noted of both hips. There is no evidence for hardware fracture or failure. There is no new acute displaced fracture. Vascular calcifications are noted. There is soft tissue swelling of the left lower extremity. IMPRESSION: 1. Status post recent intramedullary nail placement through the proximal left femur. 2. Healing intratrochanteric fracture of the proximal left femur. 3. No new acute displaced fracture identified. Electronically Signed   By: Constance Holster M.D.   On: 11/14/2020 18:04   CT Head Wo Contrast  Result Date: 11/14/2020 CLINICAL DATA:  Fall EXAM: CT  HEAD WITHOUT CONTRAST CT CERVICAL SPINE WITHOUT CONTRAST TECHNIQUE: Multidetector CT imaging of the head and cervical spine was performed following the standard protocol without intravenous contrast. Multiplanar CT image reconstructions of the cervical spine were also generated. COMPARISON:  09/30/2020 FINDINGS: CT HEAD FINDINGS Brain: No evidence of acute infarction,  hemorrhage, hydrocephalus, extra-axial collection or mass lesion/mass effect. Extensive periventricular and deep white matter hypodensity with moderate global cerebral volume loss. Vascular: No hyperdense vessel or unexpected calcification. Skull: Prior left pterional craniotomy. Negative for fracture or focal lesion. Sinuses/Orbits: No acute finding. Other: None. CT CERVICAL SPINE FINDINGS Alignment: Normal. Skull base and vertebrae: No acute fracture. No primary bone lesion or focal pathologic process. Soft tissues and spinal canal: No prevertebral fluid or swelling. No visible canal hematoma. Disc levels:  Intact. Upper chest: Negative. Other: None. IMPRESSION: 1. No acute intracranial pathology. 2. Extensive small-vessel white matter disease and moderate global cerebral volume loss. 3. Prior left pterional craniotomy. 4. No fracture or static subluxation of the cervical spine. Electronically Signed   By: Eddie Candle M.D.   On: 11/14/2020 17:59   CT Cervical Spine Wo Contrast  Result Date: 11/14/2020 CLINICAL DATA:  Fall EXAM: CT HEAD WITHOUT CONTRAST CT CERVICAL SPINE WITHOUT CONTRAST TECHNIQUE: Multidetector CT imaging of the head and cervical spine was performed following the standard protocol without intravenous contrast. Multiplanar CT image reconstructions of the cervical spine were also generated. COMPARISON:  09/30/2020 FINDINGS: CT HEAD FINDINGS Brain: No evidence of acute infarction, hemorrhage, hydrocephalus, extra-axial collection or mass lesion/mass effect. Extensive periventricular and deep white matter hypodensity with moderate global cerebral volume loss. Vascular: No hyperdense vessel or unexpected calcification. Skull: Prior left pterional craniotomy. Negative for fracture or focal lesion. Sinuses/Orbits: No acute finding. Other: None. CT CERVICAL SPINE FINDINGS Alignment: Normal. Skull base and vertebrae: No acute fracture. No primary bone lesion or focal pathologic process. Soft tissues  and spinal canal: No prevertebral fluid or swelling. No visible canal hematoma. Disc levels:  Intact. Upper chest: Negative. Other: None. IMPRESSION: 1. No acute intracranial pathology. 2. Extensive small-vessel white matter disease and moderate global cerebral volume loss. 3. Prior left pterional craniotomy. 4. No fracture or static subluxation of the cervical spine. Electronically Signed   By: Eddie Candle M.D.   On: 11/14/2020 17:59   CT Hip Left Wo Contrast  Result Date: 11/14/2020 CLINICAL DATA:  Fall, left hip pain. EXAM: CT OF THE LEFT HIP WITHOUT CONTRAST TECHNIQUE: Multidetector CT imaging of the left hip was performed according to the standard protocol. Multiplanar CT image reconstructions were also generated. COMPARISON:  Radiographs from 11/14/2020 FINDINGS: Bones/Joint/Cartilage Left hip IM nail with cephalomedullary device (integrated interlocking InterTAN nail) in place. Inter trochanteric fracture with displaced lesser trochanteric fragment observed. On images 40 through 32 of series 7, there is a nondisplaced fracture extending from the inferior margin of the inter trochanteric fracture down into the proximal diaphysis laterally up to about 4.7 cm below the lowest (medial) margin of the intertrochanteric fracture. The anterior cortical component is poorly seen but the distal most extension appears to be in the lateral cortex. Today's hip CT includes the proximal 17 cm of the IM nail but does not include the entire upper leg. Displaced lesser trochanteric fragment as before. Scattered small fragments of the greater trochanter noted. There is some osteoid deposition along the lateral margins of the lag and compression screws. Ligaments Suboptimally assessed by CT. Muscles and Tendons The iliopsoas attaches to displaced lesser trochanter fragment. Distal iliopsoas is  indistinct. Soft tissues Mild subcutaneous edema lateral to the hip.  Atherosclerosis. IMPRESSION: 1. Left hip IM nail with  cephalomedullary device in place. There is a nondisplaced fracture extending from the inferior margin of the inter trochanteric fracture obliquely down into the proximal diaphysis laterally up to about 4.7 cm below the lowest (medial) margin of the intertrochanteric fracture; I not observe that this oblique nondisplaced fracture was present on the 10/25/2020 radiographs. 2. Displaced lesser trochanteric fragment as before. 3. Atherosclerosis. Electronically Signed   By: Van Clines M.D.   On: 11/14/2020 19:58   DG FEMUR MIN 2 VIEWS LEFT  Result Date: 11/14/2020 CLINICAL DATA:  Recent fall. EXAM: LEFT FEMUR 2 VIEWS; PELVIS - 1-2 VIEW COMPARISON:  October 01, 2020 FINDINGS: The patient has undergone recent intramedullary nail placement through the proximal left femur. The hardware appears intact. There is evidence for a healing intratrochanteric fracture of the proximal left femur. Degenerative changes are noted of both hips. There is no evidence for hardware fracture or failure. There is no new acute displaced fracture. Vascular calcifications are noted. There is soft tissue swelling of the left lower extremity. IMPRESSION: 1. Status post recent intramedullary nail placement through the proximal left femur. 2. Healing intratrochanteric fracture of the proximal left femur. 3. No new acute displaced fracture identified. Electronically Signed   By: Constance Holster M.D.   On: 11/14/2020 18:04   ASSESSMENT AND PLAN:  Kenneth Mccall is a 65 y.o. male with medical history significant for hypertension, anxiety, history of alcohol abuse, history of aortic dissection status post repair, aortic valve repair in 2012 now with mechanical valve (aortic valve replacement in 03/13/2011) on warfarin, frequent falls. pt  underwent left hip IM nailing by me on 10/01/2020.  He was discharged home from skilled nursing facility approximately 2 weeks ago and had been ambulating relatively well with improving pain.  He was at  a [employer] party last night with his girlfriend when he tripped over a chair leg and fell.   Recurrent falls with left hip pain -PT/OT has been consulted-- recommends rehab -Pain control: PRN oral and IV pain meds  -- patient was seen by orthopedic surgery Dr. Posey Pronto. No plans for surgery. No fracture noted on radiology: managing. Continue PT and follow recommendations per ortho.  Hypertension-elevated, patient reports that he did not take his blood pressure medicine today -Losartan and Metoprolol succinate   History of aortic valve repair in 2012-continue warfarin -Warfarin per pharmacy for mechanical valve - Subtherapeutic INR, therefore I have added enoxaparin 60 mg q12h for bridging  History of alcohol abuse- d/c CIWA protocol. For sister in the room patient has been living with sister for last three weeks and has not consumed any alcohol. Prior to that he was at a rehab facility.  GERD Protonix 40 mg twice daily resumed  Nutrition Status: Nutrition Problem: Severe Malnutrition Etiology: other (see comment) (ETOH abuse, suspected inadequate oral intake) Signs/Symptoms: severe muscle depletion,severe fat depletion Interventions: Boost Breeze,Magic cup     DVT prophylaxis: Warfarin and enoxaparin Code Status: full code  Diet: Heart healthy Family Communication: sister in the  room Disposition Plan: to rehab  Consults called: Orthopedic Admission status: Observation MedSurg no telemetry  Level of care: Med-Surg Status is: Observation     TOTAL TIME TAKING CARE OF THIS PATIENT: *25* minutes.  >50% time spent on counselling and coordination of care  Note: This dictation was prepared with Dragon dictation along with smaller phrase technology. Any transcriptional errors that  result from this process are unintentional.  Fritzi Mandes M.D    [facility]   CC: Primary care physician; System, Provider Not InPatient ID: Kenneth Mccall, male   DOB: 1956/09/01, 65  y.o.   MRN: [id number]
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
## 2020-11-15 NOTE — ED Notes (Signed)
Pt repositioned in bed.

## 2020-11-15 NOTE — Progress Notes (Signed)
Patient has elevated bp. He's very agitated and anxious. He has refused his meds and refused to let us take his vital signs. Sharion Settler, NP notified.
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
## 2020-11-15 NOTE — ED Notes (Signed)
Lab contacted to collect labs d/t difficult stick and this RN unable to obtain
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
## 2020-11-15 NOTE — Progress Notes (Addendum)
Initial Nutrition Assessment  DOCUMENTATION CODES:   Severe malnutrition in context of social or environmental circumstances  INTERVENTION:   -Boost Breeze TID, each supplement provides 250 kcal, 9 grams protein   -Magic Cup TID, each supplement provides 290 kcal, 9 grams protein   -Recommend liberalizing diet to regular per MD ok.   NUTRITION DIAGNOSIS:   Severe Malnutrition related to social / environmental circumstances (ETOH abuse, suspected inadequate oral intake) as evidenced by severe muscle depletion,severe fat depletion.  GOAL:   Patient will meet greater than or equal to 90% of their needs  MONITOR:   PO intake,Skin,Supplement acceptance,Labs,Weight trends,I & O's  REASON FOR ASSESSMENT:   Consult Hip fracture protocol,Assessment of nutrition requirement/status  ASSESSMENT:   69 YOM admitted after a fall. 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 Possible Wernicke's.   Pt discussed with intern that he had a good appetite PTA. However pt seemed confused therefore nutrition history may not be fully accurate. He mentioned that he had not eaten since he's been in the hospital. Pt told intern that PTA his intake was:  Breakfast: 1 egg, a few pieces of bacon and toast, grits and tea  Lunch: chicken or a few pieces of deli meat sandwich with lettuce and tomato  Dinner: meatloaf or baked chicken or some type of meat with vegetable and starch  Snack: a few cookies  Intern discussed pt's diet of NPO with RN. RN discussed with intern that she had just changed pt's diet to heart healthy.   Intern discussed with pt the importance of increased protein and calories to aid in his healing process. Pt told intern that he did have some creamy Boosts at home, however he did not like them due to the thickness. Pt told  intern that he did not like Ensure Enlive due to it being too thick. Intern suggested [employer] as well as [employer] supplements and pt was willing to try these as he remembered having them on prior admissions.   Pt denied any abdominal pain, constipation or diarrhea. Pt did mention that he has experienced some nausea and vomiting after he eats breakfast. He did not remember how long this had been happening. Pt also denied any chewing difficulties, but did mention that he occasionally gags when trying to eat.   Pt's weights reviewed and no significant changes since 10/01/20.   Meds Reviewed: Folvite (1 mg, daily), Mag-Ox (800 mg, TID), MVI with minerals (daily), Miralax (17 g, daily), Thiamine (100 mg, daily).  Labs Reviewed: Glucose (122 mg/dL)  NUTRITION - FOCUSED PHYSICAL EXAM:  Flowsheet Row Most Recent Value  Orbital Region Moderate depletion  Upper Arm Region Severe depletion  Thoracic and Lumbar Region Moderate depletion  Buccal Region Severe depletion  Temple Region Severe depletion  Clavicle Bone Region Severe depletion  Clavicle and Acromion Bone Region Severe depletion  Scapular Bone Region Moderate depletion  Dorsal Hand Moderate depletion  Patellar Region Severe depletion  Anterior Thigh Region Severe depletion  Posterior Calf Region Severe depletion  Edema (RD Assessment) None  Hair Reviewed  Eyes Reviewed  Mouth Reviewed  Skin Reviewed  Nails Reviewed       Diet Order:   Diet Order            Diet Heart Room service appropriate? Yes; Fluid consistency: Thin  Diet effective now  EDUCATION NEEDS:   No education needs have been identified at this time  Skin:  Skin Assessment: Reviewed RN Assessment  Last BM:  11/13/20  Height:   Ht Readings from Last 1 Encounters:  11/15/20 6' (1.829 m)    Weight:   Wt Readings from Last 1 Encounters:  11/15/20 75 kg    Ideal Body Weight:  80.9 kg  BMI:  Body mass index is 22.42  kg/m.  Estimated Nutritional Needs:   Kcal:  2100-2300  Protein:  100-115 g  Fluid:  >/=2.1 L    Salvadore Oxford, Dietetic Intern 11/15/2020 4:40 PM

## 2020-11-15 NOTE — Evaluation (Addendum)
Occupational Therapy Evaluation Patient Details Name: Kenneth Mccall MRN: [id number] DOB: Apr 03, 1956 Today's Date: 11/15/2020    History of Present Illness Per MD notes: Pt is a 65 y.o. male with medical history significant for hypertension, anxiety, history of alcohol abuse, history of aortic dissection status post repair, aortic valve repair in 2012 now with mechanical valve, frequent falls and altered mental status previously attributed to multifactorial including possible Warnicke's, history of alcohol and marijuana use, muscle weakness, and possible dementia, history of subdural hematoma, atrial flutter, meningioma, horseshoe kidney, and hepatitis C presented to the ED for chief concerns of fall.  Per ortho note regarding CT of left proximal femoral shaft: "There does not appear to be a new fracture and is more likely from prior fracture.  Additionally, if it was a new fracture, the bone is appropriately supported with IM nail and no further surgical intervention could improve this, recommend continued weightbearing as tolerated on the left lower extremity".   Clinical Impression   Kenneth Mccall was seen for OT evaluation this date. Prior to hospital admission, pt was MOD I for mobility and ADLs using hemiwalker. Pt lives alone in mobile home with ramped entrance and PCA 1-2x/week for IADLs. Pt's sister at bedside t/o session, reports she is available PRN. Pt presents to acute OT demonstrating impaired ADL performance and functional mobility 2/2 decreased safety awareness, functional strength/ROM/balance deficits, and decreased activity tolerance. At start of session RN in room to deliver medications, pt reporting 7/10 hip pain and medication administered during session.  Pt currently requires MOD A don B socks at bed level. MIN A exit R side of bed. MIN A + HW sit<>stand, decreasing to MOD A for ~5 side steps to foot of bed. Pt unable to side step towards R side to return to [facility], required MOD A scoot  t/f. Pt would benefit from skilled OT to address noted impairments and functional limitations (see below for any additional details) in order to maximize safety and independence while minimizing falls risk and caregiver burden. Upon hospital discharge, recommend STR to maximize pt safety and return to PLOF.     Follow Up Recommendations  SNF    Equipment Recommendations  Other (comment) (TBD)    Recommendations for Other Services       Precautions / Restrictions Precautions Precautions: Fall Restrictions Weight Bearing Restrictions: Yes LLE Weight Bearing: Weight bearing as tolerated      Mobility Bed Mobility Overal bed mobility: Needs Assistance Bed Mobility: Rolling;Sidelying to Sit;Sit to Sidelying Rolling: Min assist Sidelying to sit: Min assist     Sit to sidelying: Mod assist General bed mobility comments: Log roll training to the R with pillow between the knees for pain control and decreased caregiver assistance    Transfers Overall transfer level: Needs assistance Equipment used: Hemi-walker Transfers: Sit to/from Stand;Lateral/Scoot Transfers Sit to Stand: Min assist;From elevated surface        Lateral/Scoot Transfers: Mod assist General transfer comment: Increased assistance for ~5 side steps at EOB    Balance Overall balance assessment: Needs assistance Sitting-balance support: Feet supported;Single extremity supported Sitting balance-Leahy Scale: Good     Standing balance support: Single extremity supported Standing balance-Leahy Scale: Fair Standing balance comment: Heavy lean on the [place] for support                           ADL either performed or assessed with clinical judgement   ADL Overall ADL's : Needs assistance/impaired  General ADL Comments: MOD A don B socks at bed level. MOD A + HW for ADL t/f.                  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   Lower Extremity Assessment Lower Extremity Assessment: LLE deficits/detail LLE: Unable to fully assess due to pain       Communication Communication Communication: No difficulties   Cognition Arousal/Alertness: Awake/alert Behavior During Therapy: WFL for tasks assessed/performed Overall Cognitive Status: History of cognitive impairments - at baseline                                 General Comments: Requires cues to attend to task   General Comments       Exercises Exercises: Other exercises Other Exercises Other Exercises: Pt and family educated re: OT role, DME recs, d/c recs, falls prevention, ECS, pain mgmt Other Exercises: LBD, sup<>sit, sit<>stand x2, sitting/standing balance/tolernace, ~5 side steps, lateral scoot   Shoulder Instructions      Home Living Family/patient expects to be discharged to:: Private residence Living Arrangements: Alone Available Help at Discharge: Personal care attendant;Available PRN/intermittently;Family Type of Home: Mobile home Home Access: Ramped entrance     Home Layout: One level     Bathroom Shower/Tub: Walk-in shower         Home Equipment: Environmental consultant - 4 wheels;Shower seat;Cane - single point;Cane - quad;Wheelchair - manual;Other (comment)   Additional Comments: hemiwalker      Prior Functioning/Environment Level of Independence: Independent with assistive device(s)        Comments: Mod ind amb in the home with a HW, rollator in the community, history of multiple falls;   PCA 1-2x/week for IADLs        OT Problem List: Decreased strength;Decreased range of motion;Decreased activity tolerance;Impaired balance (sitting and/or standing);Decreased safety awareness      OT Treatment/Interventions: Self-care/ADL training;Therapeutic exercise;Energy conservation;DME and/or AE instruction;Patient/family education;Balance training    OT Goals(Current goals can be found in the care plan section) Acute Rehab OT Goals Patient Stated Goal: To walk without pain OT Goal Formulation: With patient/family Time For Goal Achievement: 11/29/20 Potential to Achieve Goals: Good ADL Goals Pt Will Perform Grooming: with modified independence;sitting Pt Will Perform Lower Body Dressing: with min assist;sitting/lateral leans Pt Will Transfer to Toilet: with min assist;ambulating;bedside commode (c LRAD PRN)  OT Frequency: Min 1X/week   Barriers to D/C: Decreased caregiver support             AM-PAC OT "6 Clicks" Daily Activity     Outcome Measure Help from another person eating meals?: None Help from another person taking care of personal grooming?: A Little Help from another person toileting, which includes using toliet, bedpan, or urinal?: A Lot Help from another person bathing (including washing, rinsing, drying)?: A Lot Help from another person to put on and taking off regular upper body clothing?: A Little Help from another person to put on and taking off regular lower body clothing?: A Lot 6 Click Score: 16   End of Session Equipment Utilized During  Treatment: Other (comment) Photographer) Nurse Communication: Mobility status  Activity Tolerance: Patient tolerated treatment well Patient left: in bed;with call bell/phone within reach;with bed alarm set;with family/visitor present  OT Visit Diagnosis: Other abnormalities of gait and mobility (R26.89);Muscle weakness (generalized) (M62.81)                Time: 4599-7741 OT Time Calculation (min): 26 min Charges:   OT General Charges $OT Visit: 1 Visit OT Evaluation $OT Eval Low Complexity: 1 Low OT Treatments $Self Care/Home Management : 23-37 mins  Dessie Coma, M.S. OTR/L  11/15/20, 3:04 PM  ascom [phone]

## 2020-11-15 NOTE — Progress Notes (Signed)
Sister at bedside. Sister concerned about pt's mental orientation.  Pt answered all questions regarding, time, place, situation, and person. Will continue to monitor.

## 2020-11-16 DIAGNOSIS — T402X5A Adverse effect of other opioids, initial encounter: Secondary | ICD-10-CM | POA: Diagnosis present

## 2020-11-16 DIAGNOSIS — F0390 Unspecified dementia without behavioral disturbance: Secondary | ICD-10-CM | POA: Diagnosis present

## 2020-11-16 DIAGNOSIS — W19XXXA Unspecified fall, initial encounter: Secondary | ICD-10-CM | POA: Diagnosis not present

## 2020-11-16 DIAGNOSIS — R296 Repeated falls: Secondary | ICD-10-CM | POA: Diagnosis present

## 2020-11-16 DIAGNOSIS — F419 Anxiety disorder, unspecified: Secondary | ICD-10-CM | POA: Diagnosis present

## 2020-11-16 DIAGNOSIS — S72002S Fracture of unspecified part of neck of left femur, sequela: Secondary | ICD-10-CM | POA: Diagnosis not present

## 2020-11-16 DIAGNOSIS — Z952 Presence of prosthetic heart valve: Secondary | ICD-10-CM | POA: Diagnosis not present

## 2020-11-16 DIAGNOSIS — Z7901 Long term (current) use of anticoagulants: Secondary | ICD-10-CM | POA: Diagnosis not present

## 2020-11-16 DIAGNOSIS — W010XXA Fall on same level from slipping, tripping and stumbling without subsequent striking against object, initial encounter: Secondary | ICD-10-CM | POA: Diagnosis present

## 2020-11-16 DIAGNOSIS — K219 Gastro-esophageal reflux disease without esophagitis: Secondary | ICD-10-CM | POA: Diagnosis present

## 2020-11-16 DIAGNOSIS — B1920 Unspecified viral hepatitis C without hepatic coma: Secondary | ICD-10-CM | POA: Diagnosis present

## 2020-11-16 DIAGNOSIS — Z5320 Procedure and treatment not carried out because of patient's decision for unspecified reasons: Secondary | ICD-10-CM | POA: Diagnosis not present

## 2020-11-16 DIAGNOSIS — W19XXXD Unspecified fall, subsequent encounter: Secondary | ICD-10-CM | POA: Diagnosis present

## 2020-11-16 DIAGNOSIS — I10 Essential (primary) hypertension: Secondary | ICD-10-CM | POA: Diagnosis present

## 2020-11-16 DIAGNOSIS — Z20822 Contact with and (suspected) exposure to covid-19: Secondary | ICD-10-CM | POA: Diagnosis present

## 2020-11-16 DIAGNOSIS — M25552 Pain in left hip: Secondary | ICD-10-CM | POA: Diagnosis present

## 2020-11-16 DIAGNOSIS — E43 Unspecified severe protein-calorie malnutrition: Secondary | ICD-10-CM | POA: Diagnosis present

## 2020-11-16 DIAGNOSIS — K5903 Drug induced constipation: Secondary | ICD-10-CM | POA: Diagnosis present

## 2020-11-16 DIAGNOSIS — S72009A Fracture of unspecified part of neck of unspecified femur, initial encounter for closed fracture: Secondary | ICD-10-CM | POA: Diagnosis present

## 2020-11-16 DIAGNOSIS — G4700 Insomnia, unspecified: Secondary | ICD-10-CM | POA: Diagnosis present

## 2020-11-16 DIAGNOSIS — W19XXXS Unspecified fall, sequela: Secondary | ICD-10-CM | POA: Diagnosis present

## 2020-11-16 DIAGNOSIS — Z79899 Other long term (current) drug therapy: Secondary | ICD-10-CM | POA: Diagnosis not present

## 2020-11-16 DIAGNOSIS — F1010 Alcohol abuse, uncomplicated: Secondary | ICD-10-CM | POA: Diagnosis present

## 2020-11-16 DIAGNOSIS — Z86011 Personal history of benign neoplasm of the brain: Secondary | ICD-10-CM | POA: Diagnosis not present

## 2020-11-16 DIAGNOSIS — Z6822 Body mass index (BMI) 22.0-22.9, adult: Secondary | ICD-10-CM | POA: Diagnosis not present

## 2020-11-16 DIAGNOSIS — S72142D Displaced intertrochanteric fracture of left femur, subsequent encounter for closed fracture with routine healing: Secondary | ICD-10-CM | POA: Diagnosis not present

## 2020-11-16 LAB — PROTIME-INR
INR: 1.6 — ABNORMAL HIGH (ref 0.8–1.2)
Prothrombin Time: 18.8 seconds — ABNORMAL HIGH (ref 11.4–15.2)

## 2020-11-16 MED ORDER — MORPHINE SULFATE (PF) 2 MG/ML IV SOLN: 2.0000 mg | Freq: Four times a day (QID) | INTRAVENOUS | Status: DC | PRN

## 2020-11-16 MED ORDER — WARFARIN SODIUM 7.5 MG PO TABS
7.5000 mg | ORAL_TABLET | Freq: Once | ORAL | Status: AC
  Administered 2020-11-16: 7.5 mg via ORAL
  Filled 2020-11-16: qty 1

## 2020-11-16 MED ORDER — HYDRALAZINE HCL 20 MG/ML IJ SOLN: 10.0000 mg | Freq: Four times a day (QID) | INTRAMUSCULAR | Status: DC | PRN

## 2020-11-16 NOTE — TOC Initial Note (Signed)
Transition of Care [facility]) - Initial/Assessment Note    Patient Details  Name: Kenneth Mccall MRN: [id number] Date of Birth: 08/18/1956  Transition of [facility]) CM/SW Contact:    Shelbie Ammons, RN Phone Number: 11/16/2020, 9:39 AM  Clinical Narrative:  RNCM reached out to patient's sister [name] for assessment. [name] reports that patient has been living with her for the last couple of weeks since coming home from [facility] and has been increasingly hard to take care of and she feels getting weaker. She reports that patient has not drank any alcohol since coming home. Discussed current recommendations for SNF at discharge and [name] reports she believes that this is a good idea. Discussed that since he has recently had a stay in SNF this will likely impact what he is eligible for this time. [name] is agreeable to a search of all local facilities.  RNCM verified PASSR, completed FL2 and started bed search.                  Expected Discharge Plan: Skilled Nursing Facility Barriers to Discharge: No Barriers Identified   Patient Goals and CMS Choice        Expected Discharge Plan and Services Expected Discharge Plan: [place]       Living arrangements for the past 2 months: [place]                                      Prior Living Arrangements/Services Living arrangements for the past 2 months: [place] Lives with:: Siblings Patient language and need for interpreter reviewed:: Yes Do you feel safe going back to the place where you live?: Yes      Need for Family Participation in Patient Care: Yes (Comment) Care giver support system in place?: Yes (comment)   Criminal Activity/Legal Involvement Pertinent to Current Situation/Hospitalization: No - Comment as needed  Activities of Daily Living Home Assistive Devices/Equipment: Gilford Rile (specify type) ADL Screening (condition at time of  admission) Patient's cognitive ability adequate to safely complete daily activities?: Yes Is the patient deaf or have difficulty hearing?: No Does the patient have difficulty seeing, even when wearing glasses/contacts?: No Does the patient have difficulty concentrating, remembering, or making decisions?: Yes Patient able to express need for assistance with ADLs?: Yes Does the patient have difficulty dressing or bathing?: Yes Independently performs ADLs?: Yes (appropriate for developmental age) Does the patient have difficulty walking or climbing stairs?: Yes Weakness of Legs: Both Weakness of Arms/Hands: Both  Permission Sought/Granted                  Emotional Assessment           Psych Involvement: No (comment)  Admission diagnosis:  Hip fracture ([place]) [S72.009A] Fall [W19.XXXA] Closed fracture of left hip, sequela [S72.002S] Fall, initial encounter [W19.XXXA] Patient Active Problem List   Diagnosis Date Noted  . Protein-calorie malnutrition, severe 11/15/2020  . Fall   . Hip fracture ([place]) 11/14/2020  . Constipation due to opioid therapy 11/14/2020  . Malnutrition of moderate degree 10/08/2020  . Acute distention of stomach   . Pulmonary embolus ([place])   . Nausea vomiting and diarrhea   . Hx of aortic valve replacement   . Hypokalemia   . Hypomagnesemia   . Hypophosphatemia   . Essential hypertension   . Anemia   . 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  Closed hip fracture requiring operative repair, left, sequela 09/30/2020   PCP:  System, Provider Not In Pharmacy:   [place], [place] - [address], [place] [address] Phone: [phone] Fax: [phone]     Social Determinants of Health (SDOH) Interventions    Readmission Risk Interventions No flowsheet data found.

## 2020-11-16 NOTE — Care Management Obs Status (Signed)
[place] NOTIFICATION   Patient Details  Name: Kenneth Mccall MRN: [id number] Date of Birth: 10/09/1955   Medicare Observation Status Notification Given:  Yes    Shelbie Ammons, RN 11/16/2020, 9:20 AM

## 2020-11-16 NOTE — Progress Notes (Signed)
[place] [facility] [place] NAME: Kenneth Mccall    MR#:  [id number]  DATE OF BIRTH:  [birth year]  SUBJECTIVE:   Patient was just discharge from rehab about three weeks ago has been staying with his sister getting home PT fell while tripped over the chair when he was at a party.   According to the RN patient was confused last night refused to take meds. He received a dose of Ativan last night. Remains sedated. Awakens on verbal commands however doesn't respond much very sleepy. REVIEW OF SYSTEMS:   Review of Systems  Unable to perform ROS: Mental status change     DRUG ALLERGIES:   Allergies  Allergen Reactions  . Anectine [Succinylcholine Chloride] Anaphylaxis  . Atenolol Other (See Comments)    Dry mouth  . Enalapril Other (See Comments)    cough    VITALS:  Blood pressure (!) 174/104, pulse 87, temperature 98.6 F (37 C), resp. rate 16, height 6' (1.829 m), weight 75 kg, SpO2 99 %.  PHYSICAL EXAMINATION:   Physical Exam  GENERAL:  65 y.o.-year-old patient lying in the bed with no acute distress.  Thin, fraile CARDIOVASCULAR: S1, S2 normal. No murmurs, rubs, or gallops.  ABDOMEN: Soft, nontender, nondistended. Bowel sounds present. No organomegaly or mass.  EXTREMITIES: No cyanosis, clubbing or edema b/l.    NEUROLOGIC: sleepy today PSYCHIATRIC:  patient is sleepy from Ativan. Responsible will commence opens eyes momentarily of falls back to sleep SKIN: No obvious rash, lesion, or ulcer.   LABORATORY PANEL:  CBC Recent Labs  Lab 11/14/20 1721  WBC 8.3  HGB 10.8*  HCT 34.9*  PLT 187    Chemistries  Recent Labs  Lab 11/14/20 2156  NA 135  K 4.3  CL 99  CO2 29  GLUCOSE 122*  BUN 10  CREATININE 0.73  CALCIUM 9.0  MG 1.7  AST 18  ALT 7  ALKPHOS 117  BILITOT 0.7   Cardiac Enzymes No results for input(s): TROPONINI in the last 168 hours. RADIOLOGY:  DG Pelvis 1-2 Views  Result Date: 11/14/2020 CLINICAL DATA:   Recent fall. EXAM: LEFT FEMUR 2 VIEWS; PELVIS - 1-2 VIEW COMPARISON:  October 01, 2020 FINDINGS: The patient has undergone recent intramedullary nail placement through the proximal left femur. The hardware appears intact. There is evidence for a healing intratrochanteric fracture of the proximal left femur. Degenerative changes are noted of both hips. There is no evidence for hardware fracture or failure. There is no new acute displaced fracture. Vascular calcifications are noted. There is soft tissue swelling of the left lower extremity. IMPRESSION: 1. Status post recent intramedullary nail placement through the proximal left femur. 2. Healing intratrochanteric fracture of the proximal left femur. 3. No new acute displaced fracture identified. Electronically Signed   By: Constance Holster M.D.   On: 11/14/2020 18:04   CT Head Wo Contrast  Result Date: 11/14/2020 CLINICAL DATA:  Fall EXAM: CT HEAD WITHOUT CONTRAST CT CERVICAL SPINE WITHOUT CONTRAST TECHNIQUE: Multidetector CT imaging of the head and cervical spine was performed following the standard protocol without intravenous contrast. Multiplanar CT image reconstructions of the cervical spine were also generated. COMPARISON:  09/30/2020 FINDINGS: CT HEAD FINDINGS Brain: No evidence of acute infarction, hemorrhage, hydrocephalus, extra-axial collection or mass lesion/mass effect. Extensive periventricular and deep white matter hypodensity with moderate global cerebral volume loss. Vascular: No hyperdense vessel or unexpected calcification. Skull: Prior left pterional craniotomy. Negative for fracture or focal lesion.  Sinuses/Orbits: No acute finding. Other: None. CT CERVICAL SPINE FINDINGS Alignment: Normal. Skull base and vertebrae: No acute fracture. No primary bone lesion or focal pathologic process. Soft tissues and spinal canal: No prevertebral fluid or swelling. No visible canal hematoma. Disc levels:  Intact. Upper chest: Negative. Other: None.  IMPRESSION: 1. No acute intracranial pathology. 2. Extensive small-vessel white matter disease and moderate global cerebral volume loss. 3. Prior left pterional craniotomy. 4. No fracture or static subluxation of the cervical spine. Electronically Signed   By: Eddie Candle M.D.   On: 11/14/2020 17:59   CT Cervical Spine Wo Contrast  Result Date: 11/14/2020 CLINICAL DATA:  Fall EXAM: CT HEAD WITHOUT CONTRAST CT CERVICAL SPINE WITHOUT CONTRAST TECHNIQUE: Multidetector CT imaging of the head and cervical spine was performed following the standard protocol without intravenous contrast. Multiplanar CT image reconstructions of the cervical spine were also generated. COMPARISON:  09/30/2020 FINDINGS: CT HEAD FINDINGS Brain: No evidence of acute infarction, hemorrhage, hydrocephalus, extra-axial collection or mass lesion/mass effect. Extensive periventricular and deep white matter hypodensity with moderate global cerebral volume loss. Vascular: No hyperdense vessel or unexpected calcification. Skull: Prior left pterional craniotomy. Negative for fracture or focal lesion. Sinuses/Orbits: No acute finding. Other: None. CT CERVICAL SPINE FINDINGS Alignment: Normal. Skull base and vertebrae: No acute fracture. No primary bone lesion or focal pathologic process. Soft tissues and spinal canal: No prevertebral fluid or swelling. No visible canal hematoma. Disc levels:  Intact. Upper chest: Negative. Other: None. IMPRESSION: 1. No acute intracranial pathology. 2. Extensive small-vessel white matter disease and moderate global cerebral volume loss. 3. Prior left pterional craniotomy. 4. No fracture or static subluxation of the cervical spine. Electronically Signed   By: Eddie Candle M.D.   On: 11/14/2020 17:59   CT Hip Left Wo Contrast  Result Date: 11/14/2020 CLINICAL DATA:  Fall, left hip pain. EXAM: CT OF THE LEFT HIP WITHOUT CONTRAST TECHNIQUE: Multidetector CT imaging of the left hip was performed according to the  standard protocol. Multiplanar CT image reconstructions were also generated. COMPARISON:  Radiographs from 11/14/2020 FINDINGS: Bones/Joint/Cartilage Left hip IM nail with cephalomedullary device (integrated interlocking InterTAN nail) in place. Inter trochanteric fracture with displaced lesser trochanteric fragment observed. On images 40 through 32 of series 7, there is a nondisplaced fracture extending from the inferior margin of the inter trochanteric fracture down into the proximal diaphysis laterally up to about 4.7 cm below the lowest (medial) margin of the intertrochanteric fracture. The anterior cortical component is poorly seen but the distal most extension appears to be in the lateral cortex. Today's hip CT includes the proximal 17 cm of the IM nail but does not include the entire upper leg. Displaced lesser trochanteric fragment as before. Scattered small fragments of the greater trochanter noted. There is some osteoid deposition along the lateral margins of the lag and compression screws. Ligaments Suboptimally assessed by CT. Muscles and Tendons The iliopsoas attaches to displaced lesser trochanter fragment. Distal iliopsoas is indistinct. Soft tissues Mild subcutaneous edema lateral to the hip.  Atherosclerosis. IMPRESSION: 1. Left hip IM nail with cephalomedullary device in place. There is a nondisplaced fracture extending from the inferior margin of the inter trochanteric fracture obliquely down into the proximal diaphysis laterally up to about 4.7 cm below the lowest (medial) margin of the intertrochanteric fracture; I not observe that this oblique nondisplaced fracture was present on the 10/25/2020 radiographs. 2. Displaced lesser trochanteric fragment as before. 3. Atherosclerosis. Electronically Signed   By: Van Clines  M.D.   On: 11/14/2020 19:58   DG FEMUR MIN 2 VIEWS LEFT  Result Date: 11/14/2020 CLINICAL DATA:  Recent fall. EXAM: LEFT FEMUR 2 VIEWS; PELVIS - 1-2 VIEW COMPARISON:   October 01, 2020 FINDINGS: The patient has undergone recent intramedullary nail placement through the proximal left femur. The hardware appears intact. There is evidence for a healing intratrochanteric fracture of the proximal left femur. Degenerative changes are noted of both hips. There is no evidence for hardware fracture or failure. There is no new acute displaced fracture. Vascular calcifications are noted. There is soft tissue swelling of the left lower extremity. IMPRESSION: 1. Status post recent intramedullary nail placement through the proximal left femur. 2. Healing intratrochanteric fracture of the proximal left femur. 3. No new acute displaced fracture identified. Electronically Signed   By: Constance Holster M.D.   On: 11/14/2020 18:04   ASSESSMENT AND PLAN:  Jaris Kohles is a 65 y.o. male with medical history significant for hypertension, anxiety, history of alcohol abuse, history of aortic dissection status post repair, aortic valve repair in 2012 now with mechanical valve (aortic valve replacement in 03/13/2011) on warfarin, frequent falls. pt  underwent left hip IM nailing by me on 10/01/2020.  He was discharged home from skilled nursing facility approximately 2 weeks ago and had been ambulating relatively well with improving pain.  He was at a [employer] party last night with his girlfriend when he tripped over a chair leg and fell.   Recurrent falls with left hip pain -PT/OT has been consulted-- recommends rehab -Pain control: PRN oral and IV pain meds  -- patient was seen by orthopedic surgery Dr. Posey Pronto. No plans for surgery. No fracture noted on radiology: managing. Continue PT and follow recommendations per ortho.  Hypertension-elevated, patient reports that he did not take his blood pressure medicine today -Losartan and Metoprolol succinate   History of aortic valve repair in 2012-continue warfarin -Warfarin per pharmacy for mechanical valve - Subtherapeutic INR, therefore I  have added enoxaparin 60 mg q12h for bridging  History of alcohol abuse- d/c CIWA protocol. Per sister in the room patient has been living with sister for last three weeks and has not consumed any alcohol. Prior to that he was at a rehab facility.  GERD Protonix 40 mg twice daily resumed  Nutrition Status: Nutrition Problem: Severe Malnutrition Etiology: social / environmental circumstances (ETOH abuse, suspected inadequate oral intake) Signs/Symptoms: severe muscle depletion,severe fat depletion Interventions: Boost Breeze,Magic cup,Refer to RD note for recommendations     DVT prophylaxis: Warfarin  Code Status: full code  Diet: Heart healthy Family Communication: sister in the  Room on 2/15 Disposition Plan: to rehab  Consults called: Orthopedic Admission status: Observation MedSurg no telemetry  Level of care: Med-Surg Status is: Observation   Patient is recommending rehab. Discussed with TOC. Awaiting rehab facility offer.  TOTAL TIME TAKING CARE OF THIS PATIENT: *23* minutes.  >50% time spent on counselling and coordination of care  Note: This dictation was prepared with Dragon dictation along with smaller phrase technology. Any transcriptional errors that result from this process are unintentional.  Fritzi Mandes M.D    [facility]   CC: Primary care physician; System, Provider Not InPatient ID: Kenneth Mccall, male   DOB: [birth year], 65 y.o.   MRN: [id number]

## 2020-11-16 NOTE — NC FL2 (Signed)
[place] LEVEL OF CARE SCREENING TOOL     IDENTIFICATION  Patient Name: Kenneth Mccall Birthdate: May 06, 1956 Sex: male Admission Date (Current Location): 11/14/2020  [place] and [place] Number:  Engineering geologist and Address:  [facility], [address] [place],  [address]      Provider Number: [id number]  Attending Physician Name and Address:  Fritzi Mandes, MD  Relative Name and Phone Number:  Stevphen Meuse [phone]    Current Level of Care: Hospital Recommended Level of Care: [place] Prior Approval Number:    Date Approved/Denied:   PASRR Number: [id number]  Discharge Plan: SNF    Current Diagnoses: Patient Active Problem List   Diagnosis Date Noted  . Protein-calorie malnutrition, severe 11/15/2020  . Fall   . Hip fracture ([place]) 11/14/2020  . Constipation due to opioid therapy 11/14/2020  . Malnutrition of moderate degree 10/08/2020  . Acute distention of stomach   . Pulmonary embolus ([place])   . Nausea vomiting and diarrhea   . Hx of aortic valve replacement   . Hypokalemia   . Hypomagnesemia   . Hypophosphatemia   . Essential hypertension   . Anemia   . 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 Closed hip fracture requiring operative repair, left, sequela 09/30/2020    Orientation RESPIRATION BLADDER Height & Weight     Self,Place  Normal External catheter Weight: 75 kg Height:  6' (182.9 cm)  BEHAVIORAL SYMPTOMS/MOOD NEUROLOGICAL BOWEL NUTRITION STATUS      Continent Diet (Regular)  AMBULATORY STATUS COMMUNICATION OF NEEDS Skin   Extensive Assist Verbally Normal                       Personal Care Assistance Level of Assistance  Dressing,Feeding,Bathing Bathing Assistance: Limited assistance Feeding assistance: Independent Dressing Assistance: Limited assistance     Functional Limitations Info  Sight,Hearing,Speech Sight Info: Adequate Hearing Info:  Adequate Speech Info: Adequate    SPECIAL CARE FACTORS FREQUENCY  PT (By licensed PT),OT (By licensed OT)                    Contractures Contractures Info: Not present    Additional Factors Info  Code Status,Allergies Code Status Info: Full Allergies Info: Anectine, Atenolol, Enalapril           Current Medications (11/16/2020):  This is the current hospital active medication list Current Facility-Administered Medications  Medication Dose Route Frequency Provider Last Rate Last Admin  . acetaminophen (TYLENOL) tablet 325 mg  325 mg Oral Q6H PRN Cox, Amy N, DO      . albuterol (VENTOLIN HFA) 108 (90 Base) MCG/ACT inhaler 2 puff  2 puff Inhalation Q6H PRN Cox, Amy N, DO      . cholecalciferol (VITAMIN D3) tablet 1,000 Units  1,000 Units Oral Daily Fritzi Mandes, MD   1,000 Units at 11/15/20 1622  . enoxaparin (LOVENOX) injection 75 mg  1 mg/kg Subcutaneous Q12H Renda Rolls, RPH   75 mg at 11/15/20 1039  . feeding supplement (BOOST / RESOURCE BREEZE) liquid 1 Container  1 Container Oral TID BM Fritzi Mandes, MD      . folic acid (FOLVITE) tablet 1 mg  1 mg Oral Daily Cox, Amy N, DO   1 mg at 11/15/20 1039  . gabapentin (NEURONTIN) capsule 300 mg  300 mg Oral TID Cox, Amy N, DO   300 mg at 11/15/20 1622  . losartan (COZAAR) tablet 100 mg  100 mg Oral Daily Cox, Amy N, DO   100 mg at 11/15/20 1038  . magnesium oxide (MAG-OX) tablet 800 mg  800 mg Oral TID Cox, Amy N, DO   800 mg at 11/15/20 1622  . metoprolol succinate (TOPROL-XL) 24 hr tablet 100 mg  100 mg Oral Daily Cox, Amy N, DO   100 mg at 11/15/20 1039  . morphine 2 MG/ML injection 2 mg  2 mg Intravenous Q4H PRN Fritzi Mandes, MD      . multivitamin with minerals tablet 1 tablet  1 tablet Oral Daily Cox, Amy N, DO   1 tablet at 11/15/20 1038  . oxyCODONE (Oxy IR/ROXICODONE) immediate release tablet 15 mg  15 mg Oral Q6H PRN Cox, Amy N, DO   15 mg at 11/15/20 1344  . pantoprazole (PROTONIX) EC tablet 40 mg  40 mg Oral BID  Cox, Amy N, DO   40 mg at 11/15/20 1039  . polyethylene glycol (MIRALAX / GLYCOLAX) packet 17 g  17 g Oral Daily Cox, Amy N, DO      . thiamine tablet 100 mg  100 mg Oral Daily Cox, Amy N, DO   100 mg at 11/15/20 1039  . traMADol (ULTRAM) tablet 50 mg  50 mg Oral Q6H PRN Cox, Amy N, DO   50 mg at 11/15/20 1050  . traZODone (DESYREL) tablet 50 mg  50 mg Oral QHS PRN Cox, Amy N, DO   50 mg at 11/14/20 2257  . Warfarin - Pharmacist Dosing Inpatient   Does not apply q1600 Cox, Amy N, DO   Given at 11/15/20 1622     Discharge Medications: Please see discharge summary for a list of discharge medications.  Relevant Imaging Results:  Relevant Lab Results:   Additional Information SS# [id number]  Shelbie Ammons, RN
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
## 2020-11-16 NOTE — Progress Notes (Signed)
Pt unable to take medications due to being too lethargic at this time.

## 2020-11-17 LAB — PROTIME-INR
INR: 1.7 — ABNORMAL HIGH (ref 0.8–1.2)
Prothrombin Time: 19.2 seconds — ABNORMAL HIGH (ref 11.4–15.2)

## 2020-11-17 LAB — RESP PANEL BY RT-PCR (FLU A&B, COVID) ARPGX2
Influenza A by PCR: NEGATIVE
Influenza B by PCR: NEGATIVE
SARS Coronavirus 2 by RT PCR: NEGATIVE

## 2020-11-17 MED ORDER — ENOXAPARIN SODIUM 80 MG/0.8ML ~~LOC~~ SOLN: 1.0000 mg/kg | Freq: Two times a day (BID) | SUBCUTANEOUS | Status: DC

## 2020-11-17 MED ORDER — VITAMIN D3 25 MCG PO TABS: 1000.0000 [IU] | ORAL_TABLET | Freq: Every day | ORAL | Status: AC

## 2020-11-17 MED ORDER — WARFARIN SODIUM 4 MG PO TABS
8.0000 mg | ORAL_TABLET | Freq: Once | ORAL | Status: AC
  Administered 2020-11-17: 8 mg via ORAL
  Filled 2020-11-17: qty 2

## 2020-11-17 NOTE — Progress Notes (Signed)
Occupational Therapy Treatment Patient Details Name: Kenneth Mccall MRN: [id number] DOB: 1956-06-24 Today's Date: 11/17/2020    History of present illness Per MD notes: Pt is a 65 y.o. male with medical history significant for hypertension, anxiety, history of alcohol abuse, history of aortic dissection status post repair, aortic valve repair in 2012 now with mechanical valve, frequent falls and altered mental status previously attributed to multifactorial including possible Warnicke's, history of alcohol and marijuana use, muscle weakness, and possible dementia, history of subdural hematoma, atrial flutter, meningioma, horseshoe kidney, and hepatitis C presented to the ED for chief concerns of fall.  Per ortho note regarding CT of left proximal femoral shaft: "There does not appear to be a new fracture and is more likely from prior fracture.  Additionally, if it was a new fracture, the bone is appropriately supported with IM nail and no further surgical intervention could improve this, recommend continued weightbearing as tolerated on the left lower extremity".   OT comments  Kenneth Mccall was seen for OT treatment on this date. Upon arrival to room pt in bed completing breakfast, agreeable to tx. Pt requests trialing BSC t/f. Pt requires MIN A for R rolling and MOD A to achieve sitting EOB. Pt demonstrates good sitting balance at EOB. MOD A + HW for BSC t/f - assist for standing balance and advancing RLE. Pt benefited from verbal/tactile cues. MOD A for UB bathing/dressing seated on BSC - assist 2/2 baseline R hemi. MAX A don/doff B socks seated EOC. Pt reports 6/10 L hip pain at end of session, RN notified. Pt making good progress toward goals. Pt continues to benefit from skilled OT services to maximize return to PLOF and minimize risk of future falls, injury, caregiver burden, and readmission. Will continue to follow POC. Discharge recommendation remains appropriate.    Follow Up Recommendations  SNF     Equipment Recommendations  Other (comment) (TBD)    Recommendations for Other Services      Precautions / Restrictions Precautions Precautions: Fall Restrictions Weight Bearing Restrictions: Yes LLE Weight Bearing: Weight bearing as tolerated       Mobility Bed Mobility Overal bed mobility: Needs Assistance Bed Mobility: Rolling;Sidelying to Sit;Sit to Supine Rolling: Min assist Sidelying to sit: Mod assist   Sit to supine: Max assist      Transfers Overall transfer level: Needs assistance Equipment used: Hemi-walker Transfers: Sit to/from [facility] Sit to Stand: Mod assist;From elevated surface Stand pivot transfers: Mod assist            Balance Overall balance assessment: Needs assistance Sitting-balance support: Feet supported;Single extremity supported Sitting balance-Leahy Scale: Good     Standing balance support: Single extremity supported Standing balance-Leahy Scale: Poor Standing balance comment: Heavy lean on the HW for support                           ADL either performed or assessed with clinical judgement   ADL Overall ADL's : Needs assistance/impaired                                       General ADL Comments: MOD A for UB bathing/dressing seated on BSC - assist 2/2 baseline R hemi. MAX A don/doff B socks seated EOC. MOD A + HW for BSC t/f.  Cognition Arousal/Alertness: Awake/alert Behavior During Therapy: WFL for tasks assessed/performed Overall Cognitive Status: History of cognitive impairments - at baseline                                          Exercises Exercises: Other exercises Other Exercises Other Exercises: Pt educated re: HEP, adapted dressing techniques, pain mgmt, importance of functional mobility for strengthening Other Exercises: LBD, bed mobility, sit<>stand x2, sitting/standing balance/tolernace, SPT, bathing, UBD            Pertinent Vitals/ Pain       Pain Assessment: 0-10 Pain Score: 6  Pain Location: L hip Pain Descriptors / Indicators: Aching;Sore Pain Intervention(s): Limited activity within patient's tolerance;Monitored during session;Repositioned;Patient requesting pain meds-RN notified         Frequency  Min 1X/week        Progress Toward Goals  OT Goals(current goals can now be found in the care plan section)  Progress towards OT goals: Progressing toward goals  Acute Rehab OT Goals Patient Stated Goal: To walk without pain OT Goal Formulation: With patient/family Time For Goal Achievement: 11/29/20 Potential to Achieve Goals: Good ADL Goals Pt Will Perform Grooming: with modified independence;sitting Pt Will Perform Lower Body Dressing: with min assist;sitting/lateral leans Pt Will Transfer to Toilet: with min assist;ambulating;bedside commode  Plan Discharge plan remains appropriate;Frequency remains appropriate       AM-PAC OT "6 Clicks" Daily Activity     Outcome Measure   Help from another person eating meals?: None Help from another person taking care of personal grooming?: A Little Help from another person toileting, which includes using toliet, bedpan, or urinal?: A Lot Help from another person bathing (including washing, rinsing, drying)?: A Lot Help from another person to put on and taking off regular upper body clothing?: A Little Help from another person to put on and taking off regular lower body clothing?: A Lot 6 Click Score: 16    End of Session    OT Visit Diagnosis: Other abnormalities of gait and mobility (R26.89);Muscle weakness (generalized) (M62.81)   Activity Tolerance Patient tolerated treatment well   Patient Left in bed;with call bell/phone within reach;with bed alarm set;with nursing/sitter in room   Nurse Communication Mobility status        Time: 7425-9563 OT Time Calculation (min): 54 min  Charges: OT General Charges $OT Visit: 1  Visit OT Treatments $Self Care/Home Management : 53-67 mins  Dessie Coma, M.S. OTR/L  11/17/20, 10:28 AM  ascom [phone]

## 2020-11-17 NOTE — Progress Notes (Signed)
Physical Therapy Treatment Patient Details Name: Kenneth Mccall MRN: [id number] DOB: [birth year] Today's Date: 11/17/2020    History of Present Illness Per MD notes: Pt is a 65 y.o. male with medical history significant for hypertension, anxiety, history of alcohol abuse, history of aortic dissection status post repair, aortic valve repair in 2012 now with mechanical valve, frequent falls and altered mental status previously attributed to multifactorial including possible Warnicke's, history of alcohol and marijuana use, muscle weakness, and possible dementia, history of subdural hematoma, atrial flutter, meningioma, horseshoe kidney, and hepatitis C presented to the ED for chief concerns of fall.  Per ortho note regarding CT of left proximal femoral shaft: "There does not appear to be a new fracture and is more likely from prior fracture.  Additionally, if it was a new fracture, the bone is appropriately supported with IM nail and no further surgical intervention could improve this, recommend continued weightbearing as tolerated on the left lower extremity".    PT Comments    Pt was pleasant and motivated to participate during the session.  Pt required decreased assistance with bed mobility tasks with cues and practice provided using RLE to assist LLE movement in the bed.  Pt required min A with transfers to come to standing as well as for stability upon initial stand.  Pt was able to take several very small steps near the EOB and then from bed to chair with heavy lean on the RW and minimal stance time on the LLE.  Pt is at a very high risk for falls and would not be safe to return to his prior living situation at this time.  Pt will benefit from PT services in a SNF setting upon discharge to safely address deficits listed in patient problem list for decreased caregiver assistance and eventual return to PLOF.     Follow Up Recommendations  SNF     Equipment Recommendations  Other (comment) (TBD at  next venue of care)    Recommendations for Other Services       Precautions / Restrictions Precautions Precautions: Fall Restrictions Weight Bearing Restrictions: Yes LLE Weight Bearing: Weight bearing as tolerated    Mobility  Bed Mobility Overal bed mobility: Needs Assistance Bed Mobility: Rolling;Sidelying to Sit;Sit to Supine Rolling: Supervision Sidelying to sit: Supervision   Sit to supine: Max assist   General bed mobility comments: Min verbal cues for sequencing    Transfers Overall transfer level: Needs assistance Equipment used: Hemi-walker Transfers: Sit to/from Stand Sit to Stand: Min assist;From elevated surface Stand pivot transfers: Mod assist       General transfer comment: Multiple rocking attempts to stand with min A  Ambulation/Gait Ambulation/Gait assistance: Min guard Gait Distance (Feet): 3 Feet Assistive device: Hemi-walker Gait Pattern/deviations: Step-to pattern;Antalgic;Decreased stance time - left;Trunk flexed;Shuffle Gait velocity: decreased   General Gait Details: Pt with significantly decreased LLE stance time ambulating with almost a hop-to/shuffle gait pattern and only able to tolerance several small steps before returning to sitting; verbal and visual cues on proper sequencing    Stairs             Wheelchair Mobility    Modified Rankin (Stroke Patients Only)       Balance Overall balance assessment: Needs assistance Sitting-balance support: Feet supported;Single extremity supported Sitting balance-Leahy Scale: Good     Standing balance support: Single extremity supported Standing balance-Leahy Scale: Poor Standing balance comment: Heavy lean on the [facility] for support  Cognition Arousal/Alertness: Awake/alert Behavior During Therapy: WFL for tasks assessed/performed Overall Cognitive Status: History of cognitive impairments - at baseline                                         Exercises Total Joint Exercises Ankle Circles/Pumps: AROM;Strengthening;Both;10 reps;5 reps Quad Sets: Strengthening;Both;10 reps;5 reps Gluteal Sets: 10 reps;Both;Strengthening;5 reps Towel Squeeze: Strengthening;Both;5 reps;10 reps Heel Slides: AAROM;Both;10 reps;AROM Hip ABduction/ADduction: AROM;AAROM;Both;10 reps Long Arc Quad: AROM;Strengthening;Both;10 reps;15 reps Knee Flexion: AROM;Strengthening;Both;15 reps;10 reps Marching in Standing: AROM;Left;Standing;5 reps Other Exercises Other Exercises: Pt educated re: HEP, adapted dressing techniques, pain mgmt, importance of functional mobility for strengthening Other Exercises: LBD, bed mobility, sit<>stand x2, sitting/standing balance/tolernace, SPT, bathing, UBD    General Comments        Pertinent Vitals/Pain Pain Assessment: 0-10 Pain Score: 5  Pain Location: L hip Pain Descriptors / Indicators: Aching;Sore Pain Intervention(s): Premedicated before session;Monitored during session    Home Living                      Prior Function            PT Goals (current goals can now be found in the care plan section) Acute Rehab PT Goals Patient Stated Goal: To walk without pain Progress towards PT goals: Progressing toward goals    Frequency    Min 2X/week      PT Plan Current plan remains appropriate    Co-evaluation              AM-PAC PT "6 Clicks" Mobility   Outcome Measure  Help needed turning from your back to your side while in a flat bed without using bedrails?: A Little Help needed moving from lying on your back to sitting on the side of a flat bed without using bedrails?: A Little Help needed moving to and from a bed to a chair (including a wheelchair)?: A Little Help needed standing up from a chair using your arms (e.g., wheelchair or bedside chair)?: A Little Help needed to walk in hospital room?: Total Help needed climbing 3-5 steps with a railing? : Total 6 Click  Score: 14    End of Session Equipment Utilized During Treatment: Gait belt Activity Tolerance: No increased pain Patient left: in chair;with call bell/phone within reach;with chair alarm set Nurse Communication: Mobility status PT Visit Diagnosis: Unsteadiness on feet (R26.81);History of falling (Z91.81);Repeated falls (R29.6);Difficulty in walking, not elsewhere classified (R26.2);Muscle weakness (generalized) (M62.81);Pain Pain - Right/Left: Left Pain - part of body: Hip     Time: 2836-6294 PT Time Calculation (min) (ACUTE ONLY): 23 min  Charges:  $Gait Training: 8-22 mins $Therapeutic Exercise: 8-22 mins                     D. Scott Jahmani Staup PT, DPT 11/17/20, 11:54 AM

## 2020-11-17 NOTE — TOC Progression Note (Addendum)
Transition of Care [facility]) - Progression Note    Patient Details  Name: Kenneth Mccall MRN: [id number] Date of Birth: 12/22/1955  Transition of Care [facility]) CM/SW [place], RN Phone Number: 11/17/2020, 11:07 AM  Clinical Narrative:   8:30am: RNCM verified facility can take patient today. Notified attending MD and Covid was ordered.   10:45am: RNCM was called to room by patient who reports he is sorry but he is not going to [facility]. He reports he has been there before and won't go back. Patient's sister called into room and was on speaker speaking with patient. Discussed with patient that there were other bed offers and these were written down for patient. PT into room to assist patient up into recliner. Patient will call sister back and discuss facilities. Patient will return call to [facility] when he has a decision.   12:30: RNCM followed back up with patient and sister in room, he is agreeable to [facility], reached out to [facility] with [facility] and she can take him today. Placed call to [facility] and faxed new clinicals for change in facility.   1:45: RNCM called by patient who is saying now to stop transportation if it has been ordered. He now says he is going home. When asked how he will get there he says his sister will take him. RNCM notified MD, bedside nurse and will assure home health is arranged.    Expected Discharge Plan: [place] Barriers to Discharge: No Barriers Identified  Expected Discharge Plan and Services Expected Discharge Plan: [place] arrangements for the past 2 months: [place] Expected Discharge Date: 11/17/20                                     Social Determinants of Health (SDOH) Interventions    Readmission Risk Interventions No flowsheet data found.

## 2020-11-17 NOTE — Discharge Summary (Addendum)
Kenneth Mccall IRC:[id number] DOB: 1956-09-27 DOA: 11/14/2020  PCP: System, Provider Not In  Admit date: 11/14/2020 Discharge date: 11/18/20 Admitted From: Home Disposition:  SNF  Recommendations for Outpatient Follow-up:  1. Follow up with PCP in 1 week 2. Please obtain BMP/CBC in one week 3. Orthopedics in 2 weeks Dr. Posey Pronto     Discharge Condition:Stable CODE STATUS:FULL  Diet recommendation:Regular    Brief/Interim Summary: Per HPI: Kenneth Mccall is a 65 y.o. male with medical history significant for hypertension, anxiety, history of alcohol abuse, history of aortic dissection status post repair, aortic valve repair in 2012 now with mechanical valve (aortic valve replacement in 03/13/2011) on warfarin, frequent falls and altered mental status previously attributed to multifactorial including possible Warnicke's, history of alcohol and marijuana use, muscle weakness, and possible dementia, history of subdural hematoma, atrial flutter, meningioma in November 2010, horseshoe kidney, hepatitis C diagnosed in 2018, testicular hypofunction diagnosed in 2011, presented emergency department for chief concerns of fall.  He was enjoying an early Valentine's Day party and Super Bowl party dinner with his girlfriend and other friends.  He was getting up from the dinner table, trying to step around other people's chairs when he tripped over a chair leg and fell.He endorsed pain and was brought in to ED. Orthopedics was consulted.   Addendum: patient had refused rehab yesterday so stayed overnight to get INR up since could not go home with lovenox bridge. This am , had difficult time ambulating with PT, unsteady with gait. After discussion he is agreeable to go to rehab today.   Recurrent falls with left hip pain Going to Rehab patient was seen by orthopedic surgery Dr. Posey Pronto. No plans for surgery. No fracture noted on imaging. Recommended pain mx prn F/u with orthopedics in 2  weeks   Hypertension-stable now. Does appear to refuse meds at times Continue Losartan and Metoprolol succinate   History of aortic valve repair in 2012-continue warfarin - Subtherapeutic INR,On lovenox bridge until inr 2-3, need to ck inr daily and adjust coumadin dose accordingly. INR today 1.9 **Please give coumadin 7.5mg  tonight, ck INR in am.  F/u with pcp in one week   History of alcohol abuse- d/c CIWA protocol. Per sister patient has been living with sister for last three weeks and has not consumed any alcohol. Prior to that he was at a rehab facility.  GERD Protonix 40 mg twice daily   Nutrition Status: Nutrition Problem: Severe Malnutrition Etiology: social / environmental circumstances (ETOH abuse, suspected inadequate oral intake) Signs/Symptoms: severe muscle depletion,severe fat depletion Interventions: Boost Breeze,Magic cup   Discharge Diagnoses:  Active Problems:   Closed hip fracture requiring operative repair, left, sequela   Hx of aortic valve replacement   Essential hypertension   Hip fracture (HCC)   Constipation due to opioid therapy   Protein-calorie malnutrition, severe   Fall    Discharge Instructions  Discharge Instructions    Diet - low sodium heart healthy   Complete by: As directed    Diet - low sodium heart healthy   Complete by: As directed    Increase activity slowly   Complete by: As directed    Increase activity slowly   Complete by: As directed      Allergies as of 11/18/2020      Reactions   Anectine [succinylcholine Chloride] Anaphylaxis   Atenolol Other (See Comments)   Dry mouth   Enalapril Other (See Comments)   cough      Medication List  STOP taking these medications   baclofen 10 MG tablet Commonly known as: LIORESAL   GNP All Day Allergy 10 MG tablet Generic drug: cetirizine   omeprazole 40 MG capsule Commonly known as: PRILOSEC   potassium chloride 10 MEQ tablet Commonly known as: KLOR-CON    thiamine 100 MG tablet Commonly known as: Vitamin B-1     TAKE these medications   acetaminophen 325 MG tablet Commonly known as: TYLENOL Take 650 mg by mouth every 6 (six) hours as needed.   albuterol 108 (90 Base) MCG/ACT inhaler Commonly known as: VENTOLIN HFA Inhale 2 puffs into the lungs every 6 (six) hours as needed.   alum & mag hydroxide-simeth 200-200-20 MG/5ML suspension Commonly known as: MAALOX/MYLANTA Take 30 mLs by mouth every 6 (six) hours as needed for indigestion or heartburn.   enoxaparin 80 MG/0.8ML injection Commonly known as: LOVENOX Inject 0.75 mLs (75 mg total) into the skin every 12 (twelve) hours. What changed:   medication strength  See the new instructions.   folic acid 1 MG tablet Commonly known as: FOLVITE Take 1 mg by mouth daily.   gabapentin 300 MG capsule Commonly known as: NEURONTIN Take 300 mg by mouth 3 (three) times daily.   losartan 100 MG tablet Commonly known as: COZAAR Take 100 mg by mouth daily.   Magnesium Oxide 400 (240 Mg) MG Tabs Take 2 tablets by mouth 3 (three) times daily.   magnesium oxide 400 MG tablet Commonly known as: MAG-OX Take 2 tablets (800 mg total) by mouth daily.   metoCLOPramide 5 MG tablet Commonly known as: REGLAN Take 1 tablet (5 mg total) by mouth every 8 (eight) hours as needed for nausea or vomiting.   metoprolol succinate 100 MG 24 hr tablet Commonly known as: TOPROL-XL Take 100 mg by mouth daily.   multivitamin with minerals Tabs tablet Take 1 tablet by mouth daily.   oxyCODONE 15 MG immediate release tablet Commonly known as: ROXICODONE Take 1 tablet (15 mg total) by mouth every 6 (six) hours as needed for pain.   pantoprazole 40 MG tablet Commonly known as: Protonix Take 1 tablet (40 mg total) by mouth 2 (two) times daily.   polyethylene glycol 17 g packet Commonly known as: MIRALAX / GLYCOLAX Take 17 g by mouth daily as needed.   thiamine 100 MG tablet Take 1 tablet (100 mg  total) by mouth daily.   traMADol 50 MG tablet Commonly known as: ULTRAM Take 1 tablet (50 mg total) by mouth every 6 (six) hours as needed for moderate pain.   traZODone 50 MG tablet Commonly known as: DESYREL Take 1 tablet (50 mg total) by mouth at bedtime as needed for sleep.   Vitamin D3 25 MCG tablet Commonly known as: Vitamin D Take 1 tablet (1,000 Units total) by mouth daily.   warfarin 5 MG tablet Commonly known as: COUMADIN Take 1 tablet (5 mg total) by mouth daily at 4 PM.       Contact information for follow-up providers    Leim Fabry, MD On 12/08/2020.   Specialty: Orthopedic Surgery Why: at [address] Contact information: [place] [address] [phone]            Contact information for after-discharge care    Destination    [facility] .   Service: Skilled Nursing Contact information: [address] [place] [phone]  Allergies  Allergen Reactions  . Anectine [Succinylcholine Chloride] Anaphylaxis  . Atenolol Other (See Comments)    Dry mouth  . Enalapril Other (See Comments)    cough    Consultations:  orthopedics   Procedures/Studies: DG Pelvis 1-2 Views  Result Date: 11/14/2020 CLINICAL DATA:  Recent fall. EXAM: LEFT FEMUR 2 VIEWS; PELVIS - 1-2 VIEW COMPARISON:  October 01, 2020 FINDINGS: The patient has undergone recent intramedullary nail placement through the proximal left femur. The hardware appears intact. There is evidence for a healing intratrochanteric fracture of the proximal left femur. Degenerative changes are noted of both hips. There is no evidence for hardware fracture or failure. There is no new acute displaced fracture. Vascular calcifications are noted. There is soft tissue swelling of the left lower extremity. IMPRESSION: 1. Status post recent intramedullary nail placement through the proximal left femur. 2. Healing  intratrochanteric fracture of the proximal left femur. 3. No new acute displaced fracture identified. Electronically Signed   By: Constance Holster M.D.   On: 11/14/2020 18:04   CT Head Wo Contrast  Result Date: 11/14/2020 CLINICAL DATA:  Fall EXAM: CT HEAD WITHOUT CONTRAST CT CERVICAL SPINE WITHOUT CONTRAST TECHNIQUE: Multidetector CT imaging of the head and cervical spine was performed following the standard protocol without intravenous contrast. Multiplanar CT image reconstructions of the cervical spine were also generated. COMPARISON:  09/30/2020 FINDINGS: CT HEAD FINDINGS Brain: No evidence of acute infarction, hemorrhage, hydrocephalus, extra-axial collection or mass lesion/mass effect. Extensive periventricular and deep white matter hypodensity with moderate global cerebral volume loss. Vascular: No hyperdense vessel or unexpected calcification. Skull: Prior left pterional craniotomy. Negative for fracture or focal lesion. Sinuses/Orbits: No acute finding. Other: None. CT CERVICAL SPINE FINDINGS Alignment: Normal. Skull base and vertebrae: No acute fracture. No primary bone lesion or focal pathologic process. Soft tissues and spinal canal: No prevertebral fluid or swelling. No visible canal hematoma. Disc levels:  Intact. Upper chest: Negative. Other: None. IMPRESSION: 1. No acute intracranial pathology. 2. Extensive small-vessel white matter disease and moderate global cerebral volume loss. 3. Prior left pterional craniotomy. 4. No fracture or static subluxation of the cervical spine. Electronically Signed   By: Eddie Candle M.D.   On: 11/14/2020 17:59   CT Cervical Spine Wo Contrast  Result Date: 11/14/2020 CLINICAL DATA:  Fall EXAM: CT HEAD WITHOUT CONTRAST CT CERVICAL SPINE WITHOUT CONTRAST TECHNIQUE: Multidetector CT imaging of the head and cervical spine was performed following the standard protocol without intravenous contrast. Multiplanar CT image reconstructions of the cervical spine were  also generated. COMPARISON:  09/30/2020 FINDINGS: CT HEAD FINDINGS Brain: No evidence of acute infarction, hemorrhage, hydrocephalus, extra-axial collection or mass lesion/mass effect. Extensive periventricular and deep white matter hypodensity with moderate global cerebral volume loss. Vascular: No hyperdense vessel or unexpected calcification. Skull: Prior left pterional craniotomy. Negative for fracture or focal lesion. Sinuses/Orbits: No acute finding. Other: None. CT CERVICAL SPINE FINDINGS Alignment: Normal. Skull base and vertebrae: No acute fracture. No primary bone lesion or focal pathologic process. Soft tissues and spinal canal: No prevertebral fluid or swelling. No visible canal hematoma. Disc levels:  Intact. Upper chest: Negative. Other: None. IMPRESSION: 1. No acute intracranial pathology. 2. Extensive small-vessel white matter disease and moderate global cerebral volume loss. 3. Prior left pterional craniotomy. 4. No fracture or static subluxation of the cervical spine. Electronically Signed   By: Eddie Candle M.D.   On: 11/14/2020 17:59   CT Hip Left Wo Contrast  Result Date: 11/14/2020 CLINICAL DATA:  Fall, left hip pain. EXAM: CT OF THE LEFT HIP WITHOUT CONTRAST TECHNIQUE: Multidetector CT imaging of the left hip was performed according to the standard protocol. Multiplanar CT image reconstructions were also generated. COMPARISON:  Radiographs from 11/14/2020 FINDINGS: Bones/Joint/Cartilage Left hip IM nail with cephalomedullary device (integrated interlocking InterTAN nail) in place. Inter trochanteric fracture with displaced lesser trochanteric fragment observed. On images 40 through 32 of series 7, there is a nondisplaced fracture extending from the inferior margin of the inter trochanteric fracture down into the proximal diaphysis laterally up to about 4.7 cm below the lowest (medial) margin of the intertrochanteric fracture. The anterior cortical component is poorly seen but the distal  most extension appears to be in the lateral cortex. Today's hip CT includes the proximal 17 cm of the IM nail but does not include the entire upper leg. Displaced lesser trochanteric fragment as before. Scattered small fragments of the greater trochanter noted. There is some osteoid deposition along the lateral margins of the lag and compression screws. Ligaments Suboptimally assessed by CT. Muscles and Tendons The iliopsoas attaches to displaced lesser trochanter fragment. Distal iliopsoas is indistinct. Soft tissues Mild subcutaneous edema lateral to the hip.  Atherosclerosis. IMPRESSION: 1. Left hip IM nail with cephalomedullary device in place. There is a nondisplaced fracture extending from the inferior margin of the inter trochanteric fracture obliquely down into the proximal diaphysis laterally up to about 4.7 cm below the lowest (medial) margin of the intertrochanteric fracture; I not observe that this oblique nondisplaced fracture was present on the 10/25/2020 radiographs. 2. Displaced lesser trochanteric fragment as before. 3. Atherosclerosis. Electronically Signed   By: Van Clines M.D.   On: 11/14/2020 19:58   DG HIP UNILAT WITH PELVIS 2-3 VIEWS LEFT  Result Date: 10/25/2020 CLINICAL DATA:  Left hip pain EXAM: DG HIP (WITH OR WITHOUT PELVIS) 2-3V LEFT COMPARISON:  09/30/2020 FINDINGS: Left femoral neck ORIF has been performed. Avulsion of the lesser trochanter is again seen, unchanged. No acute fracture or dislocation identified. 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 IMPRESSION: No acute fracture or dislocation. Status post left femoral neck ORIF. Electronically Signed   By: Fidela Salisbury MD   On: 10/25/2020 06:24   DG FEMUR MIN 2 VIEWS LEFT  Result Date: 11/14/2020 CLINICAL DATA:  Recent fall. EXAM: LEFT FEMUR 2 VIEWS; PELVIS - 1-2 VIEW COMPARISON:  October 01, 2020 FINDINGS: The patient has undergone recent intramedullary nail  placement through the proximal left femur. The hardware appears intact. There is evidence for a healing intratrochanteric fracture of the proximal left femur. Degenerative changes are noted of both hips. There is no evidence for hardware fracture or failure. There is no new acute displaced fracture. Vascular calcifications are noted. There is soft tissue swelling of the left lower extremity. IMPRESSION: 1. Status post recent intramedullary nail placement through the proximal left femur. 2. Healing intratrochanteric fracture of the proximal left femur. 3. No new acute displaced fracture identified. Electronically Signed   By: Constance Holster M.D.   On: 11/14/2020 18:04      Subjective: No complaints. Denies sob, cp  Discharge Exam: Vitals:   11/18/20 0400 11/18/20 0811  BP: 109/73 123/82  Pulse: 74 73  Resp: 16 16  Temp: 97.7 F (36.5 C) 97.8 F (36.6 C)  SpO2: 100% 100%   Vitals:   11/17/20 1948 11/17/20 2319 11/18/20 0400 11/18/20 0811  BP: 117/69 102/61 109/73 123/82  Pulse: 83 84 74 73  Resp: 17 17 16 16   Temp: 98 F (36.7 C) 98 F (36.7 C) 97.7 F (36.5 C) 97.8 F (36.6 C)  TempSrc: Oral Oral    SpO2: 100% 98% 100% 100%  Weight:      Height:        General: Pt is alert, awake, not in acute distress Cardiovascular: RRR, S1/S2 +, no rubs, no gallops Respiratory: CTA bilaterally, no wheezing, no rhonchi Abdominal: Soft, NT, ND, bowel sounds + Extremities: no edema, no cyanosis    The results of significant diagnostics from this hospitalization (including imaging, microbiology, ancillary and laboratory) are listed below for reference.     Microbiology: Recent Results (from the past 240 hour(s))  SARS CORONAVIRUS 2 (TAT 6-24 HRS) Nasopharyngeal Nasopharyngeal Swab     Status: None   Collection Time: 11/14/20  9:06 PM   Specimen: Nasopharyngeal Swab  Result Value Ref Range Status   SARS Coronavirus 2 NEGATIVE NEGATIVE Final    Comment: (NOTE) SARS-CoV-2 target  nucleic acids are NOT DETECTED.  The SARS-CoV-2 RNA is generally detectable in upper and lower respiratory specimens during the acute phase of infection. Negative results do not preclude SARS-CoV-2 infection, do not rule out co-infections with other pathogens, and should not be used as the sole basis for treatment or other patient management decisions. Negative results must be combined with clinical observations, patient history, and epidemiological information. The expected result is Negative.  Fact Sheet for Patients: SugarRoll.be  Fact Sheet for Healthcare Providers: https://www.woods-mathews.com/  This test is not yet approved or cleared by the [place] FDA and  has been authorized for detection and/or diagnosis of SARS-CoV-2 by FDA under an Emergency Use Authorization (EUA). This EUA will remain  in effect (meaning this test can be used) for the duration of the COVID-19 declaration under Se ction 564(b)(1) of the Act, 21 U.S.C. section 360bbb-3(b)(1), unless the authorization is terminated or revoked sooner.  Performed at [facility], [place] [address], [place], [place] [address]   Resp Panel by RT-PCR (Flu A&B, Covid) Nasopharyngeal Swab     Status: None   Collection Time: 11/17/20 10:34 AM   Specimen: Nasopharyngeal Swab; Nasopharyngeal(NP) swabs in vial transport medium  Result Value Ref Range Status   SARS Coronavirus 2 by RT PCR NEGATIVE NEGATIVE Final    Comment: (NOTE) SARS-CoV-2 target nucleic acids are NOT DETECTED.  The SARS-CoV-2 RNA is generally detectable in upper respiratory specimens during the acute phase of infection. The lowest concentration of SARS-CoV-2 viral copies this assay can detect is 138 copies/mL. A negative result does not preclude SARS-Cov-2 infection and should not be used as the sole basis for treatment or other patient management decisions. A negative result may occur with  improper  specimen collection/handling, submission of specimen other than nasopharyngeal swab, presence of viral mutation(s) within the areas targeted by this assay, and inadequate number of viral copies(<138 copies/mL). A negative result must be combined with clinical observations, patient history, and epidemiological information. The expected result is Negative.  Fact Sheet for Patients:  EntrepreneurPulse.com.au  Fact Sheet for Healthcare Providers:  IncredibleEmployment.be  This test is no t yet approved or cleared by the [place] FDA and  has been authorized for detection and/or diagnosis of SARS-CoV-2 by FDA under an Emergency Use Authorization (EUA). This EUA will remain  in effect (meaning this test can be used) for the duration of the COVID-19 declaration under Section 564(b)(1) of the Act, 21 U.S.C.section 360bbb-3(b)(1), unless the authorization is terminated  or revoked sooner.       Influenza A by PCR NEGATIVE NEGATIVE Final   Influenza B by PCR NEGATIVE NEGATIVE Final    Comment: (NOTE) The Xpert Xpress SARS-CoV-2/FLU/RSV plus assay is intended as an aid in the diagnosis of influenza from Nasopharyngeal swab specimens and should not be used as a sole basis for treatment. Nasal washings and aspirates are unacceptable for Xpert Xpress SARS-CoV-2/FLU/RSV testing.  Fact Sheet for Patients: EntrepreneurPulse.com.au  Fact Sheet for Healthcare Providers: IncredibleEmployment.be  This test is not yet approved or cleared by the [place] FDA and has been authorized for detection and/or diagnosis of SARS-CoV-2 by FDA under an Emergency Use Authorization (EUA). This EUA will remain in effect (meaning this test can be used) for the duration of the COVID-19 declaration under Section 564(b)(1) of the Act, 21 U.S.C. section 360bbb-3(b)(1), unless the authorization is terminated or revoked.  Performed at  [facility], [address] [place], [place] [address]      Labs: BNP (last 3 results) Recent Labs    09/30/20 1600  BNP 324.4*   Basic Metabolic Panel: Recent Labs  Lab 11/14/20 1813 11/14/20 2156  NA 137 135  K 4.9 4.3  CL 101 99  CO2 27 29  GLUCOSE 103* 122*  BUN 10 10  CREATININE 0.77 0.73  CALCIUM 9.0 9.0  MG 1.7 1.7  PHOS  --  4.1   Liver Function Tests: Recent Labs  Lab 11/14/20 2156  AST 18  ALT 7  ALKPHOS 117  BILITOT 0.7  PROT 7.2  ALBUMIN 3.4*   No results for input(s): LIPASE, AMYLASE in the last 168 hours. No results for input(s): AMMONIA in the last 168 hours. CBC: Recent Labs  Lab 11/14/20 1721  WBC 8.3  NEUTROABS 5.5  HGB 10.8*  HCT 34.9*  MCV 82.5  PLT 187   Cardiac Enzymes: No results for input(s): CKTOTAL, CKMB, CKMBINDEX, TROPONINI in the last 168 hours. BNP: Invalid input(s): POCBNP CBG: No results for input(s): GLUCAP in the last 168 hours. D-Dimer No results for input(s): DDIMER in the last 72 hours. Hgb A1c No results for input(s): HGBA1C in the last 72 hours. Lipid Profile No results for input(s): CHOL, HDL, LDLCALC, TRIG, CHOLHDL, LDLDIRECT in the last 72 hours. Thyroid function studies No results for input(s): TSH, T4TOTAL, T3FREE, THYROIDAB in the last 72 hours.  Invalid input(s): FREET3 Anemia work up No results for input(s): VITAMINB12, FOLATE, FERRITIN, TIBC, IRON, RETICCTPCT in the last 72 hours. Urinalysis    Component Value Date/Time   COLORURINE YELLOW (A) 09/30/2020 1405   APPEARANCEUR HAZY (A) 09/30/2020 1405   LABSPEC 1.039 (H) 09/30/2020 1405   PHURINE 5.0 09/30/2020 1405   GLUCOSEU NEGATIVE 09/30/2020 1405   HGBUR MODERATE (A) 09/30/2020 1405   BILIRUBINUR NEGATIVE 09/30/2020 1405   KETONESUR NEGATIVE 09/30/2020 1405   PROTEINUR NEGATIVE 09/30/2020 1405   NITRITE NEGATIVE 09/30/2020 1405   LEUKOCYTESUR NEGATIVE 09/30/2020 1405   Sepsis Labs Invalid input(s): PROCALCITONIN,  WBC,   LACTICIDVEN Microbiology Recent Results (from the past 240 hour(s))  SARS CORONAVIRUS 2 (TAT 6-24 HRS) Nasopharyngeal Nasopharyngeal Swab     Status: None   Collection Time: 11/14/20  9:06 PM   Specimen: Nasopharyngeal Swab  Result Value Ref Range Status   SARS Coronavirus 2 NEGATIVE NEGATIVE Final    Comment: (NOTE) SARS-CoV-2 target nucleic acids are NOT DETECTED.  The SARS-CoV-2 RNA is generally detectable in upper and lower respiratory specimens during the acute phase of infection.  Negative results do not preclude SARS-CoV-2 infection, do not rule out co-infections with other pathogens, and should not be used as the sole basis for treatment or other patient management decisions. Negative results must be combined with clinical observations, patient history, and epidemiological information. The expected result is Negative.  Fact Sheet for Patients: SugarRoll.be  Fact Sheet for Healthcare Providers: https://www.woods-mathews.com/  This test is not yet approved or cleared by the [place] FDA and  has been authorized for detection and/or diagnosis of SARS-CoV-2 by FDA under an Emergency Use Authorization (EUA). This EUA will remain  in effect (meaning this test can be used) for the duration of the COVID-19 declaration under Se ction 564(b)(1) of the Act, 21 U.S.C. section 360bbb-3(b)(1), unless the authorization is terminated or revoked sooner.  Performed at [facility], [place] [address] [place], [place] [address]   Resp Panel by RT-PCR (Flu A&B, Covid) Nasopharyngeal Swab     Status: None   Collection Time: 11/17/20 10:34 AM   Specimen: Nasopharyngeal Swab; Nasopharyngeal(NP) swabs in vial transport medium  Result Value Ref Range Status   SARS Coronavirus 2 by RT PCR NEGATIVE NEGATIVE Final    Comment: (NOTE) SARS-CoV-2 target nucleic acids are NOT DETECTED.  The SARS-CoV-2 RNA is generally detectable in upper  respiratory specimens during the acute phase of infection. The lowest concentration of SARS-CoV-2 viral copies this assay can detect is 138 copies/mL. A negative result does not preclude SARS-Cov-2 infection and should not be used as the sole basis for treatment or other patient management decisions. A negative result may occur with  improper specimen collection/handling, submission of specimen other than nasopharyngeal swab, presence of viral mutation(s) within the areas targeted by this assay, and inadequate number of viral copies(<138 copies/mL). A negative result must be combined with clinical observations, patient history, and epidemiological information. The expected result is Negative.  Fact Sheet for Patients:  EntrepreneurPulse.com.au  Fact Sheet for Healthcare Providers:  IncredibleEmployment.be  This test is no t yet approved or cleared by the [place] FDA and  has been authorized for detection and/or diagnosis of SARS-CoV-2 by FDA under an Emergency Use Authorization (EUA). This EUA will remain  in effect (meaning this test can be used) for the duration of the COVID-19 declaration under Section 564(b)(1) of the Act, 21 U.S.C.section 360bbb-3(b)(1), unless the authorization is terminated  or revoked sooner.       Influenza A by PCR NEGATIVE NEGATIVE Final   Influenza B by PCR NEGATIVE NEGATIVE Final    Comment: (NOTE) The Xpert Xpress SARS-CoV-2/FLU/RSV plus assay is intended as an aid in the diagnosis of influenza from Nasopharyngeal swab specimens and should not be used as a sole basis for treatment. Nasal washings and aspirates are unacceptable for Xpert Xpress SARS-CoV-2/FLU/RSV testing.  Fact Sheet for Patients: EntrepreneurPulse.com.au  Fact Sheet for Healthcare Providers: IncredibleEmployment.be  This test is not yet approved or cleared by the [place] FDA and has been  authorized for detection and/or diagnosis of SARS-CoV-2 by FDA under an Emergency Use Authorization (EUA). This EUA will remain in effect (meaning this test can be used) for the duration of the COVID-19 declaration under Section 564(b)(1) of the Act, 21 U.S.C. section 360bbb-3(b)(1), unless the authorization is terminated or revoked.  Performed at [facility], [address] [place], [place] [address]      Time coordinating discharge: Over 30 minutes  SIGNED:   Nolberto Hanlon, MD  [facility] 11/18/2020, 9:37 AM Pager  If 7PM-7AM, please contact night-coverage [email or web address] Password [email or web address]
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
## 2020-11-18 LAB — PROTIME-INR
INR: 1.9 — ABNORMAL HIGH (ref 0.8–1.2)
Prothrombin Time: 21.2 seconds — ABNORMAL HIGH (ref 11.4–15.2)

## 2020-11-18 MED ORDER — WARFARIN SODIUM 6 MG PO TABS
6.0000 mg | ORAL_TABLET | Freq: Once | ORAL | Status: DC
  Filled 2020-11-18: qty 1

## 2020-11-18 NOTE — Progress Notes (Signed)
Pt discharged to [facility] by EMS . Attempted to call facility 2x and no answer. Will try again . Took IV out of Left AC without complications. Pt took all belongings.
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
## 2020-11-18 NOTE — Progress Notes (Addendum)
Physical Therapy Treatment Patient Details Name: Kenneth Mccall MRN: [id number] DOB: 01/31/1956 Today's Date: 11/18/2020    History of Present Illness Per MD notes: Pt is a 65 y.o. male with medical history significant for hypertension, anxiety, history of alcohol abuse, history of aortic dissection status post repair, aortic valve repair in 2012 now with mechanical valve, frequent falls and altered mental status previously attributed to multifactorial including possible Warnicke's, history of alcohol and marijuana use, muscle weakness, and possible dementia, history of subdural hematoma, atrial flutter, meningioma, horseshoe kidney, and hepatitis C presented to the ED for chief concerns of fall.  Per ortho note regarding CT of left proximal femoral shaft: "There does not appear to be a new fracture and is more likely from prior fracture.  Additionally, if it was a new fracture, the bone is appropriately supported with IM nail and no further surgical intervention could improve this, recommend continued weightbearing as tolerated on the left lower extremity".    PT Comments    Patient agreeable to PT treatment. Patient continues to require physical assistance for bed mobility, transfers, and ambulation. Patient was able to ambulate a short distance in room with hemiwalker with physical assistance for advancement of RLE and weight shifting assistance. Overall activity tolerance is limited in standing position. Patient remains a high fall risk and would be unsafe to go home at this time. Highly recommend SNF placement at discharge for ongoing PT efforts. PT will continue to follow to maximize independence and address functional limitations listed below.      Follow Up Recommendations  SNF     Equipment Recommendations   (to be determined at next level of care)    Recommendations for Other Services       Precautions / Restrictions Precautions Precautions: Fall Restrictions Weight Bearing  Restrictions: Yes LLE Weight Bearing: Weight bearing as tolerated    Mobility  Bed Mobility Overal bed mobility: Needs Assistance Bed Mobility: Supine to Sit     Supine to sit: Min assist;HOB elevated     General bed mobility comments: assistance required for trunk support. verbal cues for sequencing and technique    Transfers Overall transfer level: Needs assistance Equipment used: Hemi-walker Transfers: Sit to/from Stand Sit to Stand: Min assist         General transfer comment: patient was unable to stand without physical assistance, multiple attempts. Min A is required for lifting assistance to stand with bed in lowest height. verbal cues for technique and hand placement with transfers  Ambulation/Gait Ambulation/Gait assistance: Min assist Gait Distance (Feet): 5 Feet Assistive device: Hemi-walker Gait Pattern/deviations: Step-to pattern;Decreased step length - right;Decreased step length - left;Decreased stance time - left Gait velocity: decreased   General Gait Details: decreased stand time on LLE and assistance required for steaduing and for intermittent advancment of RLE. verbal cues for technique with weight shifting. activity tolerance limited by fatigue. patient remains a high fall risk   Stairs             Wheelchair Mobility    Modified Rankin (Stroke Patients Only)       Balance                                            Cognition Arousal/Alertness: Awake/alert Behavior During Therapy: WFL for tasks assessed/performed Overall Cognitive Status: History of cognitive impairments - at baseline  General Comments: intermittent cues for attention to task as patient is easily distracted.      Exercises Total Joint Exercises Ankle Circles/Pumps: AROM;Strengthening;Both;10 reps;Seated Gluteal Sets: AROM;Strengthening;Both;10 reps;Seated Hip ABduction/ADduction:  AROM;Strengthening;AAROM;Both;10 reps;Seated (intermittent AAROM for LLE initially, progressing to AROM) Long Arc Quad: AAROM;Strengthening;Both;10 reps;Seated (AAROM for LLE for end ROM of knee extension) Marching in Standing: AAROM;Strengthening;Both;10 reps;Seated Other Exercises Other Exercises: verbal and visual cues for strengthening exercises technique for BLE    General Comments        Pertinent Vitals/Pain Pain Assessment: Faces Faces Pain Scale: Hurts a little bit Pain Location: L hip Pain Descriptors / Indicators: Sore Pain Intervention(s): Limited activity within patient's tolerance    Home Living                      Prior Function            PT Goals (current goals can now be found in the care plan section) Acute Rehab PT Goals Patient Stated Goal: to go home PT Goal Formulation: With patient Time For Goal Achievement: 11/28/20 Potential to Achieve Goals: Good Progress towards PT goals: Progressing toward goals    Frequency    Min 2X/week      PT Plan Current plan remains appropriate    Co-evaluation              AM-PAC PT "6 Clicks" Mobility   Outcome Measure  Help needed turning from your back to your side while in a flat bed without using bedrails?: A Little Help needed moving from lying on your back to sitting on the side of a flat bed without using bedrails?: A Little Help needed moving to and from a bed to a chair (including a wheelchair)?: A Little Help needed standing up from a chair using your arms (e.g., wheelchair or bedside chair)?: A Little Help needed to walk in hospital room?: A Lot Help needed climbing 3-5 steps with a railing? : Total 6 Click Score: 15    End of Session Equipment Utilized During Treatment: Gait belt Activity Tolerance: No increased pain Patient left: in chair;with call bell/phone within reach;with bed alarm set Nurse Communication: Mobility status PT Visit Diagnosis: Unsteadiness on feet  (R26.81);History of falling (Z91.81);Repeated falls (R29.6);Difficulty in walking, not elsewhere classified (R26.2);Muscle weakness (generalized) (M62.81);Pain Pain - Right/Left: Left Pain - part of body: Hip     Time: 0175-1025 PT Time Calculation (min) (ACUTE ONLY): 29 min  Charges:  $Gait Training: 8-22 mins $Therapeutic Exercise: 8-22 mins                    [name], PT, MPT   [name] 11/18/2020, 10:24 AM

## 2020-11-19 LAB — METHYLMALONIC ACID, SERUM: Methylmalonic Acid, Quantitative: 413 nmol/L — ABNORMAL HIGH (ref 0–378)

## 2020-12-02 ENCOUNTER — Inpatient Hospital Stay
Admission: EM | Admit: 2020-12-02 | Discharge: 2020-12-10 | DRG: 480 | Disposition: A | Discharge status: Skilled Nursing Facility | Payer: Medicare Other | Attending: Internal Medicine | Admitting: Internal Medicine

## 2020-12-02 ENCOUNTER — Encounter: Payer: Self-pay | Admitting: Emergency Medicine

## 2020-12-02 ENCOUNTER — Emergency Department: Payer: Medicare Other

## 2020-12-02 ENCOUNTER — Other Ambulatory Visit: Payer: Self-pay

## 2020-12-02 DIAGNOSIS — Z86011 Personal history of benign neoplasm of the brain: Secondary | ICD-10-CM

## 2020-12-02 DIAGNOSIS — Z23 Encounter for immunization: Secondary | ICD-10-CM | POA: Diagnosis not present

## 2020-12-02 DIAGNOSIS — S72302D Unspecified fracture of shaft of left femur, subsequent encounter for closed fracture with routine healing: Secondary | ICD-10-CM | POA: Diagnosis not present

## 2020-12-02 DIAGNOSIS — S72009A Fracture of unspecified part of neck of unspecified femur, initial encounter for closed fracture: Secondary | ICD-10-CM

## 2020-12-02 DIAGNOSIS — R9082 White matter disease, unspecified: Secondary | ICD-10-CM | POA: Diagnosis present

## 2020-12-02 DIAGNOSIS — M545 Low back pain, unspecified: Secondary | ICD-10-CM

## 2020-12-02 DIAGNOSIS — I10 Essential (primary) hypertension: Secondary | ICD-10-CM | POA: Diagnosis present

## 2020-12-02 DIAGNOSIS — M549 Dorsalgia, unspecified: Secondary | ICD-10-CM | POA: Diagnosis present

## 2020-12-02 DIAGNOSIS — Z888 Allergy status to other drugs, medicaments and biological substances status: Secondary | ICD-10-CM

## 2020-12-02 DIAGNOSIS — S72001A Fracture of unspecified part of neck of right femur, initial encounter for closed fracture: Secondary | ICD-10-CM | POA: Diagnosis present

## 2020-12-02 DIAGNOSIS — G8929 Other chronic pain: Secondary | ICD-10-CM | POA: Diagnosis present

## 2020-12-02 DIAGNOSIS — I7100 Dissection of unspecified site of aorta: Secondary | ICD-10-CM | POA: Diagnosis present

## 2020-12-02 DIAGNOSIS — E8809 Other disorders of plasma-protein metabolism, not elsewhere classified: Secondary | ICD-10-CM | POA: Diagnosis present

## 2020-12-02 DIAGNOSIS — Z6821 Body mass index (BMI) 21.0-21.9, adult: Secondary | ICD-10-CM | POA: Diagnosis not present

## 2020-12-02 DIAGNOSIS — E876 Hypokalemia: Secondary | ICD-10-CM | POA: Diagnosis present

## 2020-12-02 DIAGNOSIS — E291 Testicular hypofunction: Secondary | ICD-10-CM | POA: Diagnosis present

## 2020-12-02 DIAGNOSIS — Z953 Presence of xenogenic heart valve: Secondary | ICD-10-CM

## 2020-12-02 DIAGNOSIS — I4892 Unspecified atrial flutter: Secondary | ICD-10-CM | POA: Diagnosis present

## 2020-12-02 DIAGNOSIS — Q631 Lobulated, fused and horseshoe kidney: Secondary | ICD-10-CM

## 2020-12-02 DIAGNOSIS — R296 Repeated falls: Secondary | ICD-10-CM | POA: Diagnosis present

## 2020-12-02 DIAGNOSIS — F419 Anxiety disorder, unspecified: Secondary | ICD-10-CM | POA: Diagnosis present

## 2020-12-02 DIAGNOSIS — S72141A Displaced intertrochanteric fracture of right femur, initial encounter for closed fracture: Secondary | ICD-10-CM | POA: Diagnosis present

## 2020-12-02 DIAGNOSIS — W19XXXA Unspecified fall, initial encounter: Secondary | ICD-10-CM

## 2020-12-02 DIAGNOSIS — S72001D Fracture of unspecified part of neck of right femur, subsequent encounter for closed fracture with routine healing: Secondary | ICD-10-CM | POA: Diagnosis not present

## 2020-12-02 DIAGNOSIS — E512 Wernicke's encephalopathy: Secondary | ICD-10-CM | POA: Diagnosis present

## 2020-12-02 DIAGNOSIS — Z7901 Long term (current) use of anticoagulants: Secondary | ICD-10-CM

## 2020-12-02 DIAGNOSIS — F1010 Alcohol abuse, uncomplicated: Secondary | ICD-10-CM | POA: Diagnosis present

## 2020-12-02 DIAGNOSIS — W010XXA Fall on same level from slipping, tripping and stumbling without subsequent striking against object, initial encounter: Secondary | ICD-10-CM | POA: Diagnosis present

## 2020-12-02 DIAGNOSIS — E538 Deficiency of other specified B group vitamins: Secondary | ICD-10-CM | POA: Diagnosis present

## 2020-12-02 DIAGNOSIS — D509 Iron deficiency anemia, unspecified: Secondary | ICD-10-CM | POA: Diagnosis present

## 2020-12-02 DIAGNOSIS — D62 Acute posthemorrhagic anemia: Secondary | ICD-10-CM | POA: Diagnosis not present

## 2020-12-02 DIAGNOSIS — Z79899 Other long term (current) drug therapy: Secondary | ICD-10-CM

## 2020-12-02 DIAGNOSIS — Y92009 Unspecified place in unspecified non-institutional (private) residence as the place of occurrence of the external cause: Secondary | ICD-10-CM | POA: Diagnosis not present

## 2020-12-02 DIAGNOSIS — K5900 Constipation, unspecified: Secondary | ICD-10-CM | POA: Diagnosis present

## 2020-12-02 DIAGNOSIS — Z952 Presence of prosthetic heart valve: Secondary | ICD-10-CM

## 2020-12-02 DIAGNOSIS — K219 Gastro-esophageal reflux disease without esophagitis: Secondary | ICD-10-CM | POA: Diagnosis present

## 2020-12-02 DIAGNOSIS — G8321 Monoplegia of upper limb affecting right dominant side: Secondary | ICD-10-CM | POA: Diagnosis present

## 2020-12-02 DIAGNOSIS — Z20822 Contact with and (suspected) exposure to covid-19: Secondary | ICD-10-CM | POA: Diagnosis present

## 2020-12-02 DIAGNOSIS — Z8249 Family history of ischemic heart disease and other diseases of the circulatory system: Secondary | ICD-10-CM

## 2020-12-02 DIAGNOSIS — B1920 Unspecified viral hepatitis C without hepatic coma: Secondary | ICD-10-CM | POA: Diagnosis present

## 2020-12-02 DIAGNOSIS — E559 Vitamin D deficiency, unspecified: Secondary | ICD-10-CM | POA: Diagnosis present

## 2020-12-02 DIAGNOSIS — Z419 Encounter for procedure for purposes other than remedying health state, unspecified: Secondary | ICD-10-CM

## 2020-12-02 DIAGNOSIS — E43 Unspecified severe protein-calorie malnutrition: Secondary | ICD-10-CM | POA: Diagnosis present

## 2020-12-02 DIAGNOSIS — I6203 Nontraumatic chronic subdural hemorrhage: Secondary | ICD-10-CM | POA: Diagnosis present

## 2020-12-02 LAB — TYPE AND SCREEN
ABO/RH(D): O POS
Antibody Screen: NEGATIVE

## 2020-12-02 LAB — CBC
HCT: 38.3 % — ABNORMAL LOW (ref 39.0–52.0)
Hemoglobin: 11.8 g/dL — ABNORMAL LOW (ref 13.0–17.0)
MCH: 25.2 pg — ABNORMAL LOW (ref 26.0–34.0)
MCHC: 30.8 g/dL (ref 30.0–36.0)
MCV: 81.8 fL (ref 80.0–100.0)
Platelets: 214 10*3/uL (ref 150–400)
RBC: 4.68 MIL/uL (ref 4.22–5.81)
RDW: 15.4 % (ref 11.5–15.5)
WBC: 13.7 10*3/uL — ABNORMAL HIGH (ref 4.0–10.5)
nRBC: 0 % (ref 0.0–0.2)

## 2020-12-02 LAB — RESP PANEL BY RT-PCR (FLU A&B, COVID) ARPGX2
Influenza A by PCR: NEGATIVE
Influenza B by PCR: NEGATIVE
SARS Coronavirus 2 by RT PCR: NEGATIVE

## 2020-12-02 LAB — BASIC METABOLIC PANEL
Anion gap: 10 (ref 5–15)
BUN: 12 mg/dL (ref 8–23)
CO2: 28 mmol/L (ref 22–32)
Calcium: 10.1 mg/dL (ref 8.9–10.3)
Chloride: 97 mmol/L — ABNORMAL LOW (ref 98–111)
Creatinine, Ser: 0.81 mg/dL (ref 0.61–1.24)
GFR, Estimated: >60 mL/min (ref >60)
Glucose, Bld: 119 mg/dL — ABNORMAL HIGH (ref 70–99)
Potassium: 4.4 mmol/L (ref 3.5–5.1)
Sodium: 135 mmol/L (ref 135–145)

## 2020-12-02 LAB — PROTIME-INR
INR: 1.6 — ABNORMAL HIGH (ref 0.8–1.2)
INR: 1.7 — ABNORMAL HIGH (ref 0.8–1.2)
Prothrombin Time: 18.3 seconds — ABNORMAL HIGH (ref 11.4–15.2)
Prothrombin Time: 19.1 seconds — ABNORMAL HIGH (ref 11.4–15.2)

## 2020-12-02 LAB — HEPARIN LEVEL (UNFRACTIONATED): Heparin Unfractionated: <0.1 IU/mL — ABNORMAL LOW (ref 0.30–0.70)

## 2020-12-02 LAB — APTT: aPTT: 39 seconds — ABNORMAL HIGH (ref 24–36)

## 2020-12-02 MED ORDER — LIDOCAINE HCL (PF) 1 % IJ SOLN
INTRAMUSCULAR | Status: AC
  Filled 2020-12-02: qty 5

## 2020-12-02 MED ORDER — HYDROCODONE-ACETAMINOPHEN 5-325 MG PO TABS
1.0000 | ORAL_TABLET | Freq: Four times a day (QID) | ORAL | Status: DC | PRN
  Administered 2020-12-02 (×2): 2 via ORAL
  Filled 2020-12-02 (×2): qty 2

## 2020-12-02 MED ORDER — ADULT MULTIVITAMIN W/MINERALS CH
1.0000 | ORAL_TABLET | Freq: Every day | ORAL | Status: DC
  Administered 2020-12-04 – 2020-12-10 (×7): 1 via ORAL
  Filled 2020-12-02 (×7): qty 1

## 2020-12-02 MED ORDER — CEFAZOLIN SODIUM-DEXTROSE 2-4 GM/100ML-% IV SOLN
2.0000 g | INTRAVENOUS | Status: AC
  Administered 2020-12-03: 2 g via INTRAVENOUS
  Filled 2020-12-02: qty 100

## 2020-12-02 MED ORDER — ENSURE ENLIVE PO LIQD
237.0000 mL | Freq: Three times a day (TID) | ORAL | Status: DC
  Administered 2020-12-02 – 2020-12-07 (×10): 237 mL via ORAL

## 2020-12-02 MED ORDER — HEPARIN BOLUS VIA INFUSION
4000.0000 [IU] | Freq: Once | INTRAVENOUS | Status: AC
  Administered 2020-12-02: 4000 [IU] via INTRAVENOUS
  Filled 2020-12-02: qty 4000

## 2020-12-02 MED ORDER — FENTANYL CITRATE (PF) 100 MCG/2ML IJ SOLN
50.0000 ug | Freq: Once | INTRAMUSCULAR | Status: AC
  Administered 2020-12-02: 50 ug via INTRAVENOUS
  Filled 2020-12-02: qty 2

## 2020-12-02 MED ORDER — GABAPENTIN 300 MG PO CAPS
300.0000 mg | ORAL_CAPSULE | Freq: Three times a day (TID) | ORAL | Status: DC
  Administered 2020-12-02 – 2020-12-10 (×22): 300 mg via ORAL
  Filled 2020-12-02 (×22): qty 1

## 2020-12-02 MED ORDER — FOLIC ACID 1 MG PO TABS
1.0000 mg | ORAL_TABLET | Freq: Every day | ORAL | Status: DC
Start: 2020-12-02 — End: 2020-12-10
  Administered 2020-12-02 – 2020-12-10 (×8): 1 mg via ORAL
  Filled 2020-12-02 (×8): qty 1

## 2020-12-02 MED ORDER — METOCLOPRAMIDE HCL 10 MG PO TABS: 5.0000 mg | ORAL_TABLET | Freq: Three times a day (TID) | ORAL | Status: DC | PRN

## 2020-12-02 MED ORDER — ALBUTEROL SULFATE HFA 108 (90 BASE) MCG/ACT IN AERS
2.0000 | INHALATION_SPRAY | Freq: Four times a day (QID) | RESPIRATORY_TRACT | Status: DC | PRN
  Filled 2020-12-02: qty 6.7

## 2020-12-02 MED ORDER — VITAMIN D3 25 MCG PO TABS
1000.0000 [IU] | ORAL_TABLET | Freq: Every day | ORAL | Status: DC
  Administered 2020-12-04 – 2020-12-10 (×7): 1000 [IU] via ORAL
  Filled 2020-12-02 (×15): qty 1

## 2020-12-02 MED ORDER — PANTOPRAZOLE SODIUM 40 MG PO TBEC
40.0000 mg | DELAYED_RELEASE_TABLET | Freq: Two times a day (BID) | ORAL | Status: DC
  Administered 2020-12-02 – 2020-12-10 (×15): 40 mg via ORAL
  Filled 2020-12-02 (×15): qty 1

## 2020-12-02 MED ORDER — ONDANSETRON HCL 4 MG/2ML IJ SOLN
4.0000 mg | Freq: Once | INTRAMUSCULAR | Status: AC
  Administered 2020-12-02: 4 mg via INTRAVENOUS
  Filled 2020-12-02: qty 2

## 2020-12-02 MED ORDER — LOSARTAN POTASSIUM 50 MG PO TABS
100.0000 mg | ORAL_TABLET | Freq: Every day | ORAL | Status: DC
  Administered 2020-12-02 – 2020-12-10 (×8): 100 mg via ORAL
  Filled 2020-12-02 (×8): qty 2

## 2020-12-02 MED ORDER — POLYETHYLENE GLYCOL 3350 17 G PO PACK
17.0000 g | PACK | Freq: Every day | ORAL | Status: DC | PRN
  Filled 2020-12-02: qty 1

## 2020-12-02 MED ORDER — HYDRALAZINE HCL 20 MG/ML IJ SOLN
10.0000 mg | Freq: Four times a day (QID) | INTRAMUSCULAR | Status: DC | PRN
  Filled 2020-12-02: qty 1

## 2020-12-02 MED ORDER — METOPROLOL SUCCINATE ER 50 MG PO TB24
100.0000 mg | ORAL_TABLET | Freq: Every day | ORAL | Status: DC
  Administered 2020-12-02 – 2020-12-09 (×6): 100 mg via ORAL
  Filled 2020-12-02 (×7): qty 2

## 2020-12-02 MED ORDER — HEPARIN (PORCINE) 25000 UT/250ML-% IV SOLN
1050.0000 [IU]/h | INTRAVENOUS | Status: DC
  Administered 2020-12-02: 1050 [IU]/h via INTRAVENOUS
  Filled 2020-12-02: qty 250

## 2020-12-02 MED ORDER — ENSURE ENLIVE PO LIQD: 237.0000 mL | Freq: Two times a day (BID) | ORAL | Status: DC

## 2020-12-02 MED ORDER — METHOCARBAMOL 500 MG PO TABS
500.0000 mg | ORAL_TABLET | Freq: Four times a day (QID) | ORAL | Status: DC | PRN
  Administered 2020-12-02 – 2020-12-07 (×5): 500 mg via ORAL
  Filled 2020-12-02 (×6): qty 1

## 2020-12-02 MED ORDER — TRAZODONE HCL 50 MG PO TABS
50.0000 mg | ORAL_TABLET | Freq: Every evening | ORAL | Status: DC | PRN
  Administered 2020-12-06 – 2020-12-09 (×2): 50 mg via ORAL
  Filled 2020-12-02 (×4): qty 1

## 2020-12-02 MED ORDER — MAGNESIUM OXIDE 400 (241.3 MG) MG PO TABS
800.0000 mg | ORAL_TABLET | Freq: Every day | ORAL | Status: DC
  Administered 2020-12-04 – 2020-12-10 (×7): 800 mg via ORAL
  Filled 2020-12-02 (×7): qty 2

## 2020-12-02 MED ORDER — SENNA 8.6 MG PO TABS
1.0000 | ORAL_TABLET | Freq: Two times a day (BID) | ORAL | Status: DC
  Administered 2020-12-02 – 2020-12-10 (×10): 8.6 mg via ORAL
  Filled 2020-12-02 (×14): qty 1

## 2020-12-02 MED ORDER — THIAMINE HCL 100 MG PO TABS
100.0000 mg | ORAL_TABLET | Freq: Every day | ORAL | Status: DC
  Administered 2020-12-04 – 2020-12-10 (×7): 100 mg via ORAL
  Filled 2020-12-02 (×7): qty 1

## 2020-12-02 MED ORDER — ACETAMINOPHEN 325 MG PO TABS: 650.0000 mg | ORAL_TABLET | Freq: Four times a day (QID) | ORAL | Status: DC | PRN

## 2020-12-02 MED ORDER — MORPHINE SULFATE (PF) 2 MG/ML IV SOLN
0.5000 mg | INTRAVENOUS | Status: DC | PRN
  Administered 2020-12-02 – 2020-12-03 (×5): 0.5 mg via INTRAVENOUS
  Filled 2020-12-02 (×5): qty 1

## 2020-12-02 MED ORDER — METHOCARBAMOL 1000 MG/10ML IJ SOLN
500.0000 mg | Freq: Four times a day (QID) | INTRAVENOUS | Status: DC | PRN
  Filled 2020-12-02: qty 5

## 2020-12-02 NOTE — Plan of Care (Signed)
  Problem: Education: Goal: Knowledge of General Education information will improve Description: Including pain rating scale, medication(s)/side effects and non-pharmacologic comfort measures Outcome: Progressing   Problem: Health Behavior/Discharge Planning: Goal: Ability to manage health-related needs will improve Outcome: Progressing   Problem: Clinical Measurements: Goal: Ability to maintain clinical measurements within normal limits will improve Outcome: Progressing Goal: Will remain free from infection Outcome: Progressing Goal: Diagnostic test results will improve Outcome: Progressing Goal: Respiratory complications will improve Outcome: Progressing Goal: Cardiovascular complication will be avoided Outcome: Progressing   Problem: Activity: Goal: Risk for activity intolerance will decrease Outcome: Progressing   Problem: Nutrition: Goal: Adequate nutrition will be maintained Outcome: Progressing   Problem: Coping: Goal: Level of anxiety will decrease Outcome: Progressing   Problem: Elimination: Goal: Will not experience complications related to bowel motility Outcome: Progressing Goal: Will not experience complications related to urinary retention Outcome: Progressing   Problem: Pain Managment: Goal: General experience of comfort will improve Outcome: Progressing   Problem: Safety: Goal: Ability to remain free from injury will improve Outcome: Progressing   Problem: Skin Integrity: Goal: Risk for impaired skin integrity will decrease Outcome: Progressing   Problem: Education: Goal: Verbalization of understanding the information provided (i.e., activity precautions, restrictions, etc) will improve Outcome: Progressing Goal: Individualized Educational Video(s) Outcome: Progressing   Problem: Activity: Goal: Ability to ambulate and perform ADLs will improve Outcome: Progressing   Problem: Clinical Measurements: Goal: Postoperative complications will be  avoided or minimized Outcome: Progressing   Problem: Pain Management: Goal: Pain level will decrease Outcome: Progressing

## 2020-12-02 NOTE — Progress Notes (Signed)
Initial Nutrition Assessment  DOCUMENTATION CODES:   Severe malnutrition in context of social or environmental circumstances  INTERVENTION:   Ensure Enlive po TID, each supplement provides 350 kcal and 20 grams of protein  Magic cup TID with meals, each supplement provides 290 kcal and 9 grams of protein  MVI, thiamine and folic acid po daily   Pt at high refeed risk; recommend monitor potassium, magnesium and phosphorus labs daily until stable  NUTRITION DIAGNOSIS:   Severe Malnutrition related to social / environmental circumstances (ETOH abuse) as evidenced by moderate fat depletion,severe fat depletion,severe muscle depletion.  GOAL:   Patient will meet greater than or equal to 90% of their needs  MONITOR:   PO intake,Supplement acceptance,Labs,Weight trends,Skin,I & O's  REASON FOR ASSESSMENT:   Consult Hip fracture protocol  ASSESSMENT:   65 y.o. male with medical history significant for  hypertension, anxiety, history of alcohol abuse, history of aortic dissection status post repair, aortic valve repair in 2012 now with mechanical valve (aortic valve replacement on 03/13/2011) on warfarin, history of Wernicke's encephalopathy, frequent falls, history of subdural hematoma, atrial flutter, meningioma in November 2010, horseshoe kidney, hepatitis C diagnosed in 2018, testicular hypofunction, s/p left hip IM nailing on 10/01/20 who presents with hip fracture after fall.   Met with pt in room today. Pt reports poor appetite and oral intake for several weeks pta. RD suspects pt with poor appetite and oral intake at baseline r/t etoh abuse as pt with malnutrition. Pt reports that he did eat lunch today but reports that he can't remember what he ate. Pt's dinner tray was sitting on his side table untouched; pt reports that he is not hungry now but he plans to eat later. RD discussed with pt the importance of nutrition needed to support post op healing. Pt reports that he does not  really like Ensure but after offering him all of his supplements options, he requested chocolate Ensure. RD will add supplements to help pt meet his estimated needs. Pt is likely at refeed risk. Plan is for surgery tomorrow per MD note.  Per chart, pt appears weight stable at baseline    Medications reviewed and include: folic acid, Mg oxide, MVI, protonix, senokot, thiamine, D3, heparin  Labs reviewed: wbc- 13.7(H) vitamin D 20.51(L)- 2/14  NUTRITION - FOCUSED PHYSICAL EXAM:  Flowsheet Row Most Recent Value  Orbital Region Moderate depletion  Upper Arm Region Severe depletion  Thoracic and Lumbar Region Moderate depletion  Buccal Region Severe depletion  Temple Region Severe depletion  Clavicle Bone Region Severe depletion  Clavicle and Acromion Bone Region Severe depletion  Scapular Bone Region Severe depletion  Dorsal Hand Severe depletion  Patellar Region Severe depletion  Anterior Thigh Region Severe depletion  Posterior Calf Region Severe depletion  Edema (RD Assessment) None  Hair Reviewed  Eyes Reviewed  Mouth Reviewed  Skin Reviewed  Nails Reviewed     Diet Order:   Diet Order            Diet NPO time specified  Diet effective midnight           Diet 2 gram sodium Room service appropriate? Yes; Fluid consistency: Thin  Diet effective now                EDUCATION NEEDS:   Education needs have been addressed  Skin:  Skin Assessment: Reviewed RN Assessment  Last BM:  pta  Height:   Ht Readings from Last 1 Encounters:  12/02/20 '5\' 11"'  (  1.803 m)    Weight:   Wt Readings from Last 1 Encounters:  12/02/20 77.6 kg    Ideal Body Weight:  78 kg  BMI:  Body mass index is 23.85 kg/m.  Estimated Nutritional Needs:   Kcal:  2100-2400kcal/day  Protein:  105-115g/day  Fluid:  2.0-2.3L/day  Koleen Distance MS, RD, LDN Please refer to [facility] for RD and/or RD on-call/weekend/after hours pager
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
## 2020-12-02 NOTE — ED Provider Notes (Signed)
Patient received in signout from Dr. Alfred Levins pending follow-up of imaging.  Preliminary report does show evidence of hip fracture.  CT imaging of the head shows no acute intracranial abnormality.  Patient very difficult IV stick therefore I was asked to obtain IV access using ultrasound guidance.  Discussed case in consultation with Dr. Mack Guise.  As he is on Coumadin will need admission hospitalist for further medical management.  Will consult hospitalist once blood work is back.  Due to difficulty with obtaining IV access, a 20G peripheral IV catheter was inserted using US guidance into the LUE.  The site was prepped with chlorhexidine and allowed to dry.  The patient tolerated the procedure without any complications.    Merlyn Lot, MD 12/02/20 [phone]
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
## 2020-12-02 NOTE — ED Notes (Signed)
Admitting MD at bedside.

## 2020-12-03 ENCOUNTER — Inpatient Hospital Stay: Payer: Medicare Other

## 2020-12-03 ENCOUNTER — Inpatient Hospital Stay: Payer: Medicare Other | Admitting: Anesthesiology

## 2020-12-03 ENCOUNTER — Encounter
Admission: EM | Disposition: A | Discharge status: Skilled Nursing Facility | Payer: Self-pay | Source: Home / Self Care | Attending: Internal Medicine

## 2020-12-03 ENCOUNTER — Encounter: Payer: Self-pay | Admitting: Internal Medicine

## 2020-12-03 DIAGNOSIS — Z952 Presence of prosthetic heart valve: Secondary | ICD-10-CM | POA: Diagnosis not present

## 2020-12-03 DIAGNOSIS — S72001D Fracture of unspecified part of neck of right femur, subsequent encounter for closed fracture with routine healing: Secondary | ICD-10-CM | POA: Diagnosis not present

## 2020-12-03 DIAGNOSIS — E43 Unspecified severe protein-calorie malnutrition: Secondary | ICD-10-CM | POA: Diagnosis not present

## 2020-12-03 HISTORY — PX: INTRAMEDULLARY (IM) NAIL INTERTROCHANTERIC: SHX5875

## 2020-12-03 LAB — BASIC METABOLIC PANEL
Anion gap: 7 (ref 5–15)
BUN: 14 mg/dL (ref 8–23)
CO2: 29 mmol/L (ref 22–32)
Calcium: 9 mg/dL (ref 8.9–10.3)
Chloride: 98 mmol/L (ref 98–111)
Creatinine, Ser: 0.75 mg/dL (ref 0.61–1.24)
GFR, Estimated: >60 mL/min (ref >60)
Glucose, Bld: 134 mg/dL — ABNORMAL HIGH (ref 70–99)
Potassium: 4.1 mmol/L (ref 3.5–5.1)
Sodium: 134 mmol/L — ABNORMAL LOW (ref 135–145)

## 2020-12-03 LAB — CBC
HCT: 34.1 % — ABNORMAL LOW (ref 39.0–52.0)
Hemoglobin: 10.7 g/dL — ABNORMAL LOW (ref 13.0–17.0)
MCH: 25.4 pg — ABNORMAL LOW (ref 26.0–34.0)
MCHC: 31.4 g/dL (ref 30.0–36.0)
MCV: 80.8 fL (ref 80.0–100.0)
Platelets: 172 10*3/uL (ref 150–400)
RBC: 4.22 MIL/uL (ref 4.22–5.81)
RDW: 15.2 % (ref 11.5–15.5)
WBC: 10.3 10*3/uL (ref 4.0–10.5)
nRBC: 0 % (ref 0.0–0.2)

## 2020-12-03 LAB — HEPARIN LEVEL (UNFRACTIONATED)
Heparin Unfractionated: 0.52 IU/mL (ref 0.30–0.70)
Heparin Unfractionated: <0.1 IU/mL — ABNORMAL LOW (ref 0.30–0.70)

## 2020-12-03 LAB — PROTIME-INR
INR: 1.8 — ABNORMAL HIGH (ref 0.8–1.2)
Prothrombin Time: 19.8 seconds — ABNORMAL HIGH (ref 11.4–15.2)

## 2020-12-03 SURGERY — FIXATION, FRACTURE, INTERTROCHANTERIC, WITH INTRAMEDULLARY ROD
Anesthesia: General | Site: Hip | Laterality: Right

## 2020-12-03 MED ORDER — ONDANSETRON HCL 4 MG/2ML IJ SOLN
INTRAMUSCULAR | Status: DC | PRN
  Administered 2020-12-03: 4 mg via INTRAVENOUS

## 2020-12-03 MED ORDER — KETOROLAC TROMETHAMINE 30 MG/ML IJ SOLN
INTRAMUSCULAR | Status: AC
  Filled 2020-12-03: qty 1

## 2020-12-03 MED ORDER — SODIUM CHLORIDE 0.9 % IV SOLN
INTRAVENOUS | Status: DC | PRN
  Administered 2020-12-03: 50 ug/min via INTRAVENOUS
  Administered 2020-12-03 (×2): 30 ug/min via INTRAVENOUS

## 2020-12-03 MED ORDER — PROPOFOL 10 MG/ML IV BOLUS
INTRAVENOUS | Status: DC | PRN
  Administered 2020-12-03: 150 mg via INTRAVENOUS

## 2020-12-03 MED ORDER — ONDANSETRON HCL 4 MG PO TABS: 4.0000 mg | ORAL_TABLET | Freq: Four times a day (QID) | ORAL | Status: DC | PRN

## 2020-12-03 MED ORDER — ROCURONIUM BROMIDE 10 MG/ML (PF) SYRINGE
PREFILLED_SYRINGE | INTRAVENOUS | Status: AC
  Filled 2020-12-03: qty 10

## 2020-12-03 MED ORDER — BISACODYL 10 MG RE SUPP: 10.0000 mg | Freq: Every day | RECTAL | Status: DC | PRN

## 2020-12-03 MED ORDER — ROCURONIUM BROMIDE 100 MG/10ML IV SOLN
INTRAVENOUS | Status: DC | PRN
  Administered 2020-12-03: 40 mg via INTRAVENOUS

## 2020-12-03 MED ORDER — PHENYLEPHRINE HCL (PRESSORS) 10 MG/ML IV SOLN
INTRAVENOUS | Status: DC | PRN
  Administered 2020-12-03: 100 ug via INTRAVENOUS

## 2020-12-03 MED ORDER — MIDAZOLAM HCL 2 MG/2ML IJ SOLN
INTRAMUSCULAR | Status: AC
  Filled 2020-12-03: qty 2

## 2020-12-03 MED ORDER — MIDAZOLAM HCL 5 MG/5ML IJ SOLN
INTRAMUSCULAR | Status: DC | PRN
  Administered 2020-12-03: 1 mg via INTRAVENOUS

## 2020-12-03 MED ORDER — TRAMADOL HCL 50 MG PO TABS
50.0000 mg | ORAL_TABLET | Freq: Four times a day (QID) | ORAL | Status: DC
  Administered 2020-12-03 – 2020-12-09 (×20): 50 mg via ORAL
  Filled 2020-12-03 (×22): qty 1

## 2020-12-03 MED ORDER — WARFARIN - PHARMACIST DOSING INPATIENT: Freq: Every day | Status: DC

## 2020-12-03 MED ORDER — SODIUM CHLORIDE 0.9 % IV SOLN: 75.0000 mL/h | INTRAVENOUS | Status: DC

## 2020-12-03 MED ORDER — PROPOFOL 10 MG/ML IV BOLUS
INTRAVENOUS | Status: AC
  Filled 2020-12-03: qty 20

## 2020-12-03 MED ORDER — CEFAZOLIN SODIUM-DEXTROSE 2-4 GM/100ML-% IV SOLN
2.0000 g | Freq: Four times a day (QID) | INTRAVENOUS | Status: AC
  Administered 2020-12-03 – 2020-12-04 (×2): 2 g via INTRAVENOUS
  Filled 2020-12-03 (×2): qty 100

## 2020-12-03 MED ORDER — FENTANYL CITRATE (PF) 100 MCG/2ML IJ SOLN
INTRAMUSCULAR | Status: AC
  Administered 2020-12-03: 25 ug via INTRAVENOUS
  Filled 2020-12-03: qty 2

## 2020-12-03 MED ORDER — MAGNESIUM CITRATE PO SOLN
1.0000 | Freq: Once | ORAL | Status: DC | PRN
  Filled 2020-12-03: qty 296

## 2020-12-03 MED ORDER — ONDANSETRON HCL 4 MG/2ML IJ SOLN: 4.0000 mg | Freq: Once | INTRAMUSCULAR | Status: DC | PRN

## 2020-12-03 MED ORDER — ENOXAPARIN SODIUM 40 MG/0.4ML ~~LOC~~ SOLN
40.0000 mg | SUBCUTANEOUS | Status: DC
  Filled 2020-12-03: qty 0.4

## 2020-12-03 MED ORDER — ONDANSETRON HCL 4 MG/2ML IJ SOLN: 4.0000 mg | Freq: Four times a day (QID) | INTRAMUSCULAR | Status: DC | PRN

## 2020-12-03 MED ORDER — FENTANYL CITRATE (PF) 100 MCG/2ML IJ SOLN
INTRAMUSCULAR | Status: DC | PRN
  Administered 2020-12-03 (×3): 50 ug via INTRAVENOUS

## 2020-12-03 MED ORDER — FENTANYL CITRATE (PF) 100 MCG/2ML IJ SOLN
25.0000 ug | INTRAMUSCULAR | Status: AC | PRN
  Administered 2020-12-03 (×6): 25 ug via INTRAVENOUS

## 2020-12-03 MED ORDER — FENTANYL CITRATE (PF) 100 MCG/2ML IJ SOLN
INTRAMUSCULAR | Status: AC
  Filled 2020-12-03: qty 2

## 2020-12-03 MED ORDER — OXYCODONE HCL 5 MG PO TABS
5.0000 mg | ORAL_TABLET | ORAL | Status: DC | PRN
  Administered 2020-12-04 – 2020-12-07 (×4): 10 mg via ORAL
  Administered 2020-12-08: 5 mg via ORAL
  Filled 2020-12-03: qty 1
  Filled 2020-12-03 (×5): qty 2
  Filled 2020-12-03: qty 1
  Filled 2020-12-03: qty 2

## 2020-12-03 MED ORDER — SODIUM CHLORIDE 0.9 % IR SOLN
Status: DC | PRN
  Administered 2020-12-03: 1000 mL

## 2020-12-03 MED ORDER — MENTHOL 3 MG MT LOZG
1.0000 | LOZENGE | OROMUCOSAL | Status: DC | PRN
  Filled 2020-12-03: qty 9

## 2020-12-03 MED ORDER — DEXMEDETOMIDINE (PRECEDEX) IN NS 20 MCG/5ML (4 MCG/ML) IV SYRINGE
PREFILLED_SYRINGE | INTRAVENOUS | Status: DC | PRN
  Administered 2020-12-03 (×2): 2 ug via INTRAVENOUS

## 2020-12-03 MED ORDER — VITAMIN K1 10 MG/ML IJ SOLN
10.0000 mg | INTRAVENOUS | Status: AC
  Administered 2020-12-03: 10 mg via INTRAVENOUS
  Filled 2020-12-03: qty 1

## 2020-12-03 MED ORDER — OXYCODONE HCL 5 MG PO TABS
10.0000 mg | ORAL_TABLET | ORAL | Status: DC | PRN
  Administered 2020-12-05 – 2020-12-07 (×5): 10 mg via ORAL
  Administered 2020-12-07: 15 mg via ORAL
  Administered 2020-12-07 – 2020-12-08 (×2): 10 mg via ORAL
  Administered 2020-12-09: 15 mg via ORAL
  Administered 2020-12-09: 10 mg via ORAL
  Administered 2020-12-09: 15 mg via ORAL
  Administered 2020-12-09: 10 mg via ORAL
  Administered 2020-12-10 (×2): 15 mg via ORAL
  Filled 2020-12-03 (×4): qty 2
  Filled 2020-12-03: qty 3
  Filled 2020-12-03 (×2): qty 2
  Filled 2020-12-03: qty 3
  Filled 2020-12-03: qty 2
  Filled 2020-12-03 (×2): qty 3
  Filled 2020-12-03: qty 2
  Filled 2020-12-03: qty 3

## 2020-12-03 MED ORDER — WARFARIN SODIUM 7.5 MG PO TABS
7.5000 mg | ORAL_TABLET | Freq: Once | ORAL | Status: DC
  Filled 2020-12-03: qty 1

## 2020-12-03 MED ORDER — ACETAMINOPHEN 10 MG/ML IV SOLN
INTRAVENOUS | Status: AC
  Filled 2020-12-03: qty 100

## 2020-12-03 MED ORDER — BUPIVACAINE HCL (PF) 0.5 % IJ SOLN
INTRAMUSCULAR | Status: AC
  Filled 2020-12-03: qty 10

## 2020-12-03 MED ORDER — PHENOL 1.4 % MT LIQD
1.0000 | OROMUCOSAL | Status: DC | PRN
  Filled 2020-12-03: qty 177

## 2020-12-03 MED ORDER — GENTAMICIN SULFATE 40 MG/ML IJ SOLN
INTRAMUSCULAR | Status: AC
  Filled 2020-12-03: qty 2

## 2020-12-03 MED ORDER — ACETAMINOPHEN 10 MG/ML IV SOLN
INTRAVENOUS | Status: DC | PRN
  Administered 2020-12-03: 1000 mg via INTRAVENOUS

## 2020-12-03 MED ORDER — SUGAMMADEX SODIUM 200 MG/2ML IV SOLN
INTRAVENOUS | Status: DC | PRN
  Administered 2020-12-03: 200 mg via INTRAVENOUS

## 2020-12-03 MED ORDER — DOCUSATE SODIUM 100 MG PO CAPS
100.0000 mg | ORAL_CAPSULE | Freq: Two times a day (BID) | ORAL | Status: DC
  Administered 2020-12-03 – 2020-12-10 (×12): 100 mg via ORAL
  Filled 2020-12-03 (×11): qty 1

## 2020-12-03 MED ORDER — LACTATED RINGERS IV SOLN: INTRAVENOUS | Status: DC | PRN

## 2020-12-03 MED ORDER — ALUM & MAG HYDROXIDE-SIMETH 200-200-20 MG/5ML PO SUSP: 30.0000 mL | ORAL | Status: DC | PRN

## 2020-12-03 MED ORDER — LIDOCAINE HCL (CARDIAC) PF 100 MG/5ML IV SOSY
PREFILLED_SYRINGE | INTRAVENOUS | Status: DC | PRN
  Administered 2020-12-03: 60 mg via INTRAVENOUS

## 2020-12-03 MED ORDER — KETOROLAC TROMETHAMINE 15 MG/ML IJ SOLN
7.5000 mg | Freq: Four times a day (QID) | INTRAMUSCULAR | Status: AC
  Administered 2020-12-03 – 2020-12-04 (×4): 7.5 mg via INTRAVENOUS
  Filled 2020-12-03 (×4): qty 1

## 2020-12-03 MED ORDER — HYDROMORPHONE HCL 1 MG/ML IJ SOLN: 0.5000 mg | INTRAMUSCULAR | Status: DC | PRN

## 2020-12-03 MED ORDER — KETOROLAC TROMETHAMINE 30 MG/ML IJ SOLN
INTRAMUSCULAR | Status: DC | PRN
  Administered 2020-12-03: 30 mg via INTRAVENOUS

## 2020-12-03 SURGICAL SUPPLY — 48 items
BIT DRILL 4.3MMS DISTAL GRDTED (BIT) ×1 IMPLANT
BIT DRILL PIN RETRO (DRILL) ×1 IMPLANT
BNDG COHESIVE 6X5 TAN STRL LF (GAUZE/BANDAGES/DRESSINGS) ×4 IMPLANT
CANISTER SUCT 1200ML W/VALVE (MISCELLANEOUS) IMPLANT
CORTICAL BONE SCR 5.0MM X 48MM (Screw) ×2 IMPLANT
COVER WAND RF STERILE (DRAPES) IMPLANT
DRAPE 3/4 80X56 (DRAPES) ×4 IMPLANT
DRAPE SURG 17X11 SM STRL (DRAPES) ×4 IMPLANT
DRAPE U-SHAPE 47X51 STRL (DRAPES) ×2 IMPLANT
DRAPE UTILITY 15X26 TOWEL STRL (DRAPES) ×4 IMPLANT
DRILL 4.3MMS DISTAL GRADUATED (BIT) ×2
DRILL PIN RETRO (DRILL) ×2
DRSG OPSITE POSTOP 3X4 (GAUZE/BANDAGES/DRESSINGS) ×4 IMPLANT
DRSG OPSITE POSTOP 4X10 (GAUZE/BANDAGES/DRESSINGS) ×2 IMPLANT
DRSG OPSITE POSTOP 4X14 (GAUZE/BANDAGES/DRESSINGS) IMPLANT
DRSG OPSITE POSTOP 4X6 (GAUZE/BANDAGES/DRESSINGS) ×2 IMPLANT
DURAPREP 26ML APPLICATOR (WOUND CARE) ×2 IMPLANT
ELECT REM PT RETURN 9FT ADLT (ELECTROSURGICAL) ×2
ELECTRODE REM PT RTRN 9FT ADLT (ELECTROSURGICAL) ×1 IMPLANT
GAUZE XEROFORM 1X8 LF (GAUZE/BANDAGES/DRESSINGS) ×2 IMPLANT
GLOVE SURG 9.0 ORTHO LTXF (GLOVE) ×4 IMPLANT
GLOVE SURG UNDER POLY LF SZ9 (GLOVE) ×2 IMPLANT
GOWN STRL REUS TWL 2XL XL LVL4 (GOWN DISPOSABLE) ×2 IMPLANT
GOWN STRL REUS W/ TWL LRG LVL3 (GOWN DISPOSABLE) ×1 IMPLANT
GOWN STRL REUS W/TWL LRG LVL3 (GOWN DISPOSABLE) ×1
GUIDEPIN VERSANAIL DSP 3.2X444 (ORTHOPEDIC DISPOSABLE SUPPLIES) ×2 IMPLANT
GUIDEWIRE BALL NOSE 100CM (WIRE) ×2 IMPLANT
HEMOVAC 400CC 10FR (MISCELLANEOUS) IMPLANT
HFN RH 130 DEG 11MM X 420MM (Nail) ×2 IMPLANT
HIP FR NAIL LAG SCREW 10.5X110 (Orthopedic Implant) ×2 IMPLANT
KIT TURNOVER CYSTO (KITS) ×2 IMPLANT
MANIFOLD NEPTUNE II (INSTRUMENTS) ×2 IMPLANT
MAT ABSORB  FLUID 56X50 GRAY (MISCELLANEOUS) ×1
MAT ABSORB FLUID 56X50 GRAY (MISCELLANEOUS) ×1 IMPLANT
NS IRRIG 1000ML POUR BTL (IV SOLUTION) ×2 IMPLANT
PACK HIP COMPR (MISCELLANEOUS) ×2 IMPLANT
PAD ARMBOARD 7.5X6 YLW CONV (MISCELLANEOUS) ×4 IMPLANT
SCREW BONE CORTICAL 5.0X50 (Screw) ×2 IMPLANT
SCREW CORTICL BON 5.0MM X 48MM (Screw) ×1 IMPLANT
SCREW LAG HIP FR NAIL 10.5X110 (Orthopedic Implant) ×1 IMPLANT
STAPLER SKIN PROX 35W (STAPLE) ×2 IMPLANT
SUCTION FRAZIER HANDLE 10FR (MISCELLANEOUS) ×1
SUCTION TUBE FRAZIER 10FR DISP (MISCELLANEOUS) ×1 IMPLANT
SUT VIC AB 0 CT1 36 (SUTURE) ×4 IMPLANT
SUT VIC AB 2-0 CT1 27 (SUTURE) ×1
SUT VIC AB 2-0 CT1 TAPERPNT 27 (SUTURE) ×1 IMPLANT
SUT VICRYL 0 AB UR-6 (SUTURE) ×2 IMPLANT
SYR 30ML LL (SYRINGE) ×2 IMPLANT

## 2020-12-03 NOTE — Op Note (Signed)
12/03/2020  3:17 PM  PATIENT:  Kenneth Mccall    PRE-OPERATIVE DIAGNOSIS:  Right displaced intertrochanteric hip Fracture  POST-OPERATIVE DIAGNOSIS:  Same  PROCEDURE:  INTRAMEDULLARY FIXATION OF RIGHT  INTERTROCHANTERIC HIP FRACTURE  SURGEON:  Thornton Park, MD  ANESTHESIA:   General  EBL:  100 cc  IMPLANT:  ZIMMER BIOMET AFFIXUS NAIL 58mm x 427mm with a 152mm lag screw and distal interlocking screws 106mm  and 50 mm in length.  PREOPERATIVE INDICATIONS:  Kenneth Mccall is a  65 y.o. male with a diagnosis of a displaced right femoral neck hip fracture.  Given the displacement of fracture it was recommended the patient undergo intramedullary fixation.  The risks, benefits and alternatives were discussed with the patient.  He recently underwent intramedullary fixation of a left intertrochanteric hip fracture by Dr. Leim Fabry here at [facility].  Patient is aware of the risks and benefits of the procedure.  The risks include but are not limited to infection, bleeding requiring blood transfusion, nerve or blood vessel injury, malunion, nonunion, hardware prominence, hardware failure, leg length discrepancy or change in lower extremity rotation and need for further surgery including hardware removal with conversion to a total hip arthroplasty. Medical risks include but are not limited to DVT and pulmonary embolism, myocardial infarction, stroke, pneumonia, respiratory failure and death. The patient understood these risks and wished to proceed with surgery.  OPERATIVE PROCEDURE:  The patient was brought to the operating room and placed in the supine position on the fracture table. The patient received general anesthesia as his INR this morning was 1.7 before receiving vitamin K.  A closed reduction was performed under C-arm guidance.  The fracture reduction was confirmed on both AP and lateral views. After adequate reduction was achieved, a time out was performed to verify the patient's name, date of  birth, medical record number, correct site of surgery correct procedure to be performed. The timeout was also used to verify the patient received antibiotics and all appropriate instruments, implants and radiographic studies were available in the room. Once all in attendance were in agreement, the case began. The patient was prepped and draped in a sterile fashion.  He received preoperative antibiotics with 2 g of Ancef IV.  An incision was made proximal to the greater trochanter in line with the femur. A guidewire was placed over the tip of the greater trochanter and advanced by drill into the proximal femur to the level of the lesser trochanter.  Confirmation of the drill pin position was made on AP and lateral C-arm images.  The threaded guidepin was then overdrilled with the proximal femoral entry reamer.  A ball-tipped guidewire was then advanced down the intramedullary canal, across the fracture, and down the femoral shaft to the knee.  The ball tip guidewire's position was confirmed at both the knee and hip via C-arm imaging. A depth gauge was used to measure the length of the long nail to be used. It was measured to be 420 mm. The actual nail was then inserted into the proximal femur, across the fracture site and down the femoral shaft. Its position was confirmed on AP and lateral C-arm images.  The ball tip guidewire was removed.  Once the nail was completely seated, the drill guide for the lag screw was placed through the guide arm for the Affixus nail. A guidepin was then placed through this drill guide and advanced through the lateral cortex of the femur, across the fracture site and into the femoral head  achieving a tip apex distance of less than 25 mm. The length of the drill pin was measured to be 110 mm, and then the drill for the lag screw was advanced through the lateral cortex, across the fracture site and up into the femoral head to the depth of 110.  The lag screw was then advanced by hand  into position across the fracture site into the femoral head. Its final position was confirmed on AP and lateral C-arm images. Compression was applied as traction was carefully released. The set screw in the top of the intramedullary rod was tightened by hand using a screwdriver. It was backed off a quarter turn to allow for compression at the fracture site.  The attention was then turned to placement of the distal interlocking screws. A perfect circle technique was used. 2 small stab incisions were made over the distal interlocking screw holes.  A free hand technique was used to drill both distal interlocking screws. The depth of the screw holes was measured with a depth gauge. The 60mm and 71mm screws were then advanced into position and tightened by hand. Final C-arm images of the entire intramedullary construct were taken in both the AP and lateral planes.   The wounds were irrigated copiously and closed with 0 Vicryl for closure of the deep fascia and 2-0 Vicryl for subcutaneous closure. The skin was approximated with staples. A dry sterile dressing was applied. I was scrubbed and present the entire case and all sharp, sponge and instrument counts were correct at the conclusion of the case. Patient was transferred to a hospital bed and brought to PACU in stable condition.  I spoke with the patient's sister by phone from the PACU to let her know the patient was stable in recovery room in the surgery had been performed without complication.    Timoteo Gaul, MD
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
## 2020-12-03 NOTE — Transfer of Care (Signed)
Immediate Anesthesia Transfer of Care Note  Patient: Kenneth Mccall  Procedure(s) Performed: INTRAMEDULLARY (IM) NAIL INTERTROCHANTRIC (Right Hip)  Patient Location: PACU  Anesthesia Type:General  Level of Consciousness: awake and oriented  Airway & Oxygen Therapy: Patient Spontanous Breathing and Patient connected to face mask oxygen  Post-op Assessment: Report given to RN and Post -op Vital signs reviewed and stable  Post vital signs: Reviewed and stable  Last Vitals:  Vitals Value Taken Time  BP 163/89 12/03/20 1500  Temp    Pulse 77 12/03/20 1505  Resp 22 12/03/20 1504  SpO2 98 % 12/03/20 1505  Vitals shown include unvalidated device data.  Last Pain:  Vitals:   12/03/20 1220  TempSrc: Oral  PainSc: 3          Complications: No complications documented.

## 2020-12-03 NOTE — Progress Notes (Signed)
Pt left the floor via transport in his bed.

## 2020-12-03 NOTE — Anesthesia Procedure Notes (Signed)
Procedure Name: Intubation Date/Time: 12/03/2020 1:11 PM Performed by: Allean Found, CRNA Pre-anesthesia Checklist: Patient identified, Patient being monitored, Timeout performed, Emergency Drugs available and Suction available Patient Re-evaluated:Patient Re-evaluated prior to induction Oxygen Delivery Method: Circle system utilized Preoxygenation: Pre-oxygenation with 100% oxygen Induction Type: IV induction Ventilation: Mask ventilation without difficulty Laryngoscope Size: McGraph and 4 Grade View: Grade I Tube type: Oral Tube size: 7.5 mm Number of attempts: 1 Airway Equipment and Method: Stylet Placement Confirmation: ETT inserted through vocal cords under direct vision,  positive ETCO2 and breath sounds checked- equal and bilateral Secured at: 21 cm Tube secured with: Tape Dental Injury: Teeth and Oropharynx as per pre-operative assessment

## 2020-12-04 DIAGNOSIS — S72001D Fracture of unspecified part of neck of right femur, subsequent encounter for closed fracture with routine healing: Secondary | ICD-10-CM | POA: Diagnosis not present

## 2020-12-04 DIAGNOSIS — Z952 Presence of prosthetic heart valve: Secondary | ICD-10-CM | POA: Diagnosis not present

## 2020-12-04 LAB — BASIC METABOLIC PANEL
Anion gap: 6 (ref 5–15)
BUN: 19 mg/dL (ref 8–23)
CO2: 29 mmol/L (ref 22–32)
Calcium: 8.4 mg/dL — ABNORMAL LOW (ref 8.9–10.3)
Chloride: 101 mmol/L (ref 98–111)
Creatinine, Ser: 0.7 mg/dL (ref 0.61–1.24)
GFR, Estimated: >60 mL/min (ref >60)
Glucose, Bld: 133 mg/dL — ABNORMAL HIGH (ref 70–99)
Potassium: 3.6 mmol/L (ref 3.5–5.1)
Sodium: 136 mmol/L (ref 135–145)

## 2020-12-04 LAB — CBC WITH DIFFERENTIAL/PLATELET
Abs Immature Granulocytes: 0.09 10*3/uL — ABNORMAL HIGH (ref 0.00–0.07)
Basophils Absolute: 0.1 10*3/uL (ref 0.0–0.1)
Basophils Relative: 0 %
Eosinophils Absolute: 0.1 10*3/uL (ref 0.0–0.5)
Eosinophils Relative: 1 %
HCT: 28.2 % — ABNORMAL LOW (ref 39.0–52.0)
Hemoglobin: 8.8 g/dL — ABNORMAL LOW (ref 13.0–17.0)
Immature Granulocytes: 1 %
Lymphocytes Relative: 7 %
Lymphs Abs: 1.2 10*3/uL (ref 0.7–4.0)
MCH: 25.3 pg — ABNORMAL LOW (ref 26.0–34.0)
MCHC: 31.2 g/dL (ref 30.0–36.0)
MCV: 81 fL (ref 80.0–100.0)
Monocytes Absolute: 1.2 10*3/uL — ABNORMAL HIGH (ref 0.1–1.0)
Monocytes Relative: 8 %
Neutro Abs: 13.6 10*3/uL — ABNORMAL HIGH (ref 1.7–7.7)
Neutrophils Relative %: 83 %
Platelets: 150 10*3/uL (ref 150–400)
RBC: 3.48 MIL/uL — ABNORMAL LOW (ref 4.22–5.81)
RDW: 15.1 % (ref 11.5–15.5)
WBC: 16.3 10*3/uL — ABNORMAL HIGH (ref 4.0–10.5)
nRBC: 0 % (ref 0.0–0.2)

## 2020-12-04 LAB — PROTIME-INR
INR: 1.2 (ref 0.8–1.2)
Prothrombin Time: 14.9 seconds (ref 11.4–15.2)

## 2020-12-04 LAB — HEPARIN LEVEL (UNFRACTIONATED): Heparin Unfractionated: 0.11 IU/mL — ABNORMAL LOW (ref 0.30–0.70)

## 2020-12-04 MED ORDER — HEPARIN BOLUS VIA INFUSION
2300.0000 [IU] | Freq: Once | INTRAVENOUS | Status: AC
  Administered 2020-12-04: 2300 [IU] via INTRAVENOUS
  Filled 2020-12-04: qty 2300

## 2020-12-04 MED ORDER — SODIUM CHLORIDE 0.9 % IV SOLN
INTRAVENOUS | Status: DC | PRN
  Administered 2020-12-08 – 2020-12-09 (×2): 250 mL via INTRAVENOUS

## 2020-12-04 MED ORDER — WARFARIN SODIUM 7.5 MG PO TABS: 7.5000 mg | ORAL_TABLET | Freq: Once | ORAL | Status: DC

## 2020-12-04 MED ORDER — HEPARIN (PORCINE) 25000 UT/250ML-% IV SOLN
1750.0000 [IU]/h | INTRAVENOUS | Status: DC
  Administered 2020-12-04: 16:00:00 1050 [IU]/h via INTRAVENOUS
  Administered 2020-12-05: 1350 [IU]/h via INTRAVENOUS
  Administered 2020-12-06: 04:00:00 1400 [IU]/h via INTRAVENOUS
  Administered 2020-12-06: 22:00:00 1550 [IU]/h via INTRAVENOUS
  Administered 2020-12-07: 14:00:00 1600 [IU]/h via INTRAVENOUS
  Administered 2020-12-08: 1750 [IU]/h via INTRAVENOUS
  Administered 2020-12-08: 1600 [IU]/h via INTRAVENOUS
  Administered 2020-12-09 – 2020-12-10 (×2): 1750 [IU]/h via INTRAVENOUS
  Filled 2020-12-04 (×9): qty 250

## 2020-12-04 MED ORDER — HEPARIN (PORCINE) 25000 UT/250ML-% IV SOLN: 1050.0000 [IU]/h | INTRAVENOUS | Status: DC

## 2020-12-04 MED ORDER — WARFARIN SODIUM 5 MG PO TABS: 5.0000 mg | ORAL_TABLET | Freq: Once | ORAL | Status: DC

## 2020-12-04 MED ORDER — WARFARIN SODIUM 5 MG PO TABS
5.0000 mg | ORAL_TABLET | Freq: Once | ORAL | Status: AC
  Administered 2020-12-04: 5 mg via ORAL
  Filled 2020-12-04 (×2): qty 1
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
## 2020-12-04 NOTE — Progress Notes (Signed)
Subjective:  Patient reports pain as mild.  Sitting up in bed having breakfast.  Objective:   VITALS:   Vitals:   12/03/20 2055 12/03/20 2311 12/04/20 0442 12/04/20 0835  BP: 110/74 97/73 115/69 120/72  Pulse: 75 75 92 91  Resp: 18 17 18 18   Temp: 99.7 F (37.6 C) 99.4 F (37.4 C) 98.1 F (36.7 C) 98.5 F (36.9 C)  TempSrc:  Oral    SpO2: 96% 97% 98% 99%  Weight:      Height:        PHYSICAL EXAM:  Sensation intact distally Dorsiflexion/Plantar flexion intact Incision: dressing C/D/I No cellulitis present Compartment soft  LABS  Results for orders placed or performed during the hospital encounter of 12/02/20 (from the past 24 hour(s))  Protime-INR     Status: None   Collection Time: 12/04/20  4:54 AM  Result Value Ref Range   Prothrombin Time 14.9 11.4 - 15.2 seconds   INR 1.2 0.8 - 1.2  CBC with Differential/Platelet     Status: Abnormal   Collection Time: 12/04/20  4:54 AM  Result Value Ref Range   WBC 16.3 (H) 4.0 - 10.5 K/uL   RBC 3.48 (L) 4.22 - 5.81 MIL/uL   Hemoglobin 8.8 (L) 13.0 - 17.0 g/dL   HCT 28.2 (L) 39.0 - 52.0 %   MCV 81.0 80.0 - 100.0 fL   MCH 25.3 (L) 26.0 - 34.0 pg   MCHC 31.2 30.0 - 36.0 g/dL   RDW 15.1 11.5 - 15.5 %   Platelets 150 150 - 400 K/uL   nRBC 0.0 0.0 - 0.2 %   Neutrophils Relative % 83 %   Neutro Abs 13.6 (H) 1.7 - 7.7 K/uL   Lymphocytes Relative 7 %   Lymphs Abs 1.2 0.7 - 4.0 K/uL   Monocytes Relative 8 %   Monocytes Absolute 1.2 (H) 0.1 - 1.0 K/uL   Eosinophils Relative 1 %   Eosinophils Absolute 0.1 0.0 - 0.5 K/uL   Basophils Relative 0 %   Basophils Absolute 0.1 0.0 - 0.1 K/uL   Immature Granulocytes 1 %   Abs Immature Granulocytes 0.09 (H) 0.00 - 0.07 K/uL  Basic metabolic panel     Status: Abnormal   Collection Time: 12/04/20  4:54 AM  Result Value Ref Range   Sodium 136 135 - 145 mmol/L   Potassium 3.6 3.5 - 5.1 mmol/L   Chloride 101 98 - 111 mmol/L   CO2 29 22 - 32 mmol/L   Glucose, Bld 133 (H) 70 - 99  mg/dL   BUN 19 8 - 23 mg/dL   Creatinine, Ser 0.70 0.61 - 1.24 mg/dL   Calcium 8.4 (L) 8.9 - 10.3 mg/dL   GFR, Estimated >60 >60 mL/min   Anion gap 6 5 - 15    CT Head Wo Contrast  Result Date: 12/02/2020 CLINICAL DATA:  Fall, inter trochanteric right hip fracture. Anti coagulation. EXAM: CT HEAD WITHOUT CONTRAST TECHNIQUE: Contiguous axial images were obtained from the base of the skull through the vertex without intravenous contrast. COMPARISON:  11/14/2020 FINDINGS: Brain: Underlying the left frontotemporal craniotomy site, there is chronically stable accentuated density for example on image 17 of series 2 unchanged from the earliest available comparison of 09/30/2020, favoring either dural thickening or chronic subdural hematoma. This is not appreciably changed compared to 09/30/2020 and measures about 2-3 mm in thickness. Periventricular white matter and corona radiata hypodensities favor chronic ischemic microvascular white matter disease. Otherwise, the brainstem, cerebellum, cerebral peduncles,  thalamus, basal ganglia, basilar cisterns, and ventricular system appear within normal limits. No mass lesion or acute CVA identified. Vascular: There is atherosclerotic calcification of the cavernous carotid arteries bilaterally. Skull: Left frontotemporal craniotomy.  No acute calvarial findings. Sinuses/Orbits: Unremarkable Other: No supplemental non-categorized findings. IMPRESSION: 1. No acute intracranial findings. 2. Stable accentuated density underlying the left frontotemporal craniotomy site measuring 2-3 mm in thickness, favoring either dural thickening or chronic subdural hematoma. This is not appreciably changed compared to 09/30/2020. 3. Periventricular white matter and corona radiata hypodensities favor chronic ischemic microvascular white matter disease. Electronically Signed   By: Van Clines M.D.   On: 12/02/2020 09:49   DG HIP OPERATIVE UNILAT W OR W/O PELVIS RIGHT  Result Date:  12/03/2020 CLINICAL DATA:  ORIF right femur EXAM: RIGHT FEMUR 2 VIEWS; OPERATIVE RIGHT HIP WITH PELVIS COMPARISON:  None. FINDINGS: Multiple C-arm images show placement of a gamma nail on the right with distal locking screws. Good reduction of the inter trochanteric fracture, with anatomic alignment. IMPRESSION: ORIF right intertrochanteric fracture with gamma nail placement. Good reduction. No complicating feature. Good alignment. No unexpected finding. Electronically Signed   By: Nelson Chimes M.D.   On: 12/03/2020 16:11   DG FEMUR, MIN 2 VIEWS RIGHT  Result Date: 12/03/2020 CLINICAL DATA:  ORIF right femur EXAM: RIGHT FEMUR 2 VIEWS; OPERATIVE RIGHT HIP WITH PELVIS COMPARISON:  None. FINDINGS: Multiple C-arm images show placement of a gamma nail on the right with distal locking screws. Good reduction of the inter trochanteric fracture, with anatomic alignment. IMPRESSION: ORIF right intertrochanteric fracture with gamma nail placement. Good reduction. No complicating feature. Good alignment. No unexpected finding. Electronically Signed   By: Nelson Chimes M.D.   On: 12/03/2020 16:11    Assessment/Plan: 1 Day Post-Op   Principal Problem:   Closed right hip fracture (HCC) Active Problems:   Hx of aortic valve replacement   Essential hypertension   Protein-calorie malnutrition, severe   Fall   Chronic back pain   Advance diet Up with therapy  OK for anticoagulation per medical service. Will follow his incision closely.   Continue physical therapy as medically appropriate. He may be discharged to rehab once cleared by medicine.  Patient will follow up with Dr. Mack Guise in 10 to 14 days after discharge.  He is weightbearing as tolerated.   Lovell Sheehan , MD 12/04/2020, 9:10 AM

## 2020-12-05 DIAGNOSIS — S72001D Fracture of unspecified part of neck of right femur, subsequent encounter for closed fracture with routine healing: Secondary | ICD-10-CM | POA: Diagnosis not present

## 2020-12-05 DIAGNOSIS — I10 Essential (primary) hypertension: Secondary | ICD-10-CM | POA: Diagnosis not present

## 2020-12-05 DIAGNOSIS — Z952 Presence of prosthetic heart valve: Secondary | ICD-10-CM | POA: Diagnosis not present

## 2020-12-05 LAB — CBC WITH DIFFERENTIAL/PLATELET
Abs Immature Granulocytes: 0.08 10*3/uL — ABNORMAL HIGH (ref 0.00–0.07)
Basophils Absolute: 0.1 10*3/uL (ref 0.0–0.1)
Basophils Relative: 1 %
Eosinophils Absolute: 0.2 10*3/uL (ref 0.0–0.5)
Eosinophils Relative: 1 %
HCT: 25.2 % — ABNORMAL LOW (ref 39.0–52.0)
Hemoglobin: 8.1 g/dL — ABNORMAL LOW (ref 13.0–17.0)
Immature Granulocytes: 1 %
Lymphocytes Relative: 11 %
Lymphs Abs: 1.4 10*3/uL (ref 0.7–4.0)
MCH: 25.6 pg — ABNORMAL LOW (ref 26.0–34.0)
MCHC: 32.1 g/dL (ref 30.0–36.0)
MCV: 79.5 fL — ABNORMAL LOW (ref 80.0–100.0)
Monocytes Absolute: 1.5 10*3/uL — ABNORMAL HIGH (ref 0.1–1.0)
Monocytes Relative: 12 %
Neutro Abs: 9.4 10*3/uL — ABNORMAL HIGH (ref 1.7–7.7)
Neutrophils Relative %: 74 %
Platelets: 154 10*3/uL (ref 150–400)
RBC: 3.17 MIL/uL — ABNORMAL LOW (ref 4.22–5.81)
RDW: 15.2 % (ref 11.5–15.5)
WBC: 12.6 10*3/uL — ABNORMAL HIGH (ref 4.0–10.5)
nRBC: 0 % (ref 0.0–0.2)

## 2020-12-05 LAB — BASIC METABOLIC PANEL
Anion gap: 8 (ref 5–15)
BUN: 25 mg/dL — ABNORMAL HIGH (ref 8–23)
CO2: 27 mmol/L (ref 22–32)
Calcium: 8.6 mg/dL — ABNORMAL LOW (ref 8.9–10.3)
Chloride: 101 mmol/L (ref 98–111)
Creatinine, Ser: 0.82 mg/dL (ref 0.61–1.24)
GFR, Estimated: >60 mL/min (ref >60)
Glucose, Bld: 136 mg/dL — ABNORMAL HIGH (ref 70–99)
Potassium: 3.4 mmol/L — ABNORMAL LOW (ref 3.5–5.1)
Sodium: 136 mmol/L (ref 135–145)

## 2020-12-05 LAB — PROTIME-INR
INR: 1.2 (ref 0.8–1.2)
Prothrombin Time: 14.4 seconds (ref 11.4–15.2)

## 2020-12-05 LAB — HEPARIN LEVEL (UNFRACTIONATED)
Heparin Unfractionated: 0.37 IU/mL (ref 0.30–0.70)
Heparin Unfractionated: 0.3 IU/mL (ref 0.30–0.70)
Heparin Unfractionated: 0.34 IU/mL (ref 0.30–0.70)

## 2020-12-05 MED ORDER — WARFARIN SODIUM 7.5 MG PO TABS
7.5000 mg | ORAL_TABLET | Freq: Once | ORAL | Status: AC
  Administered 2020-12-05: 7.5 mg via ORAL
  Filled 2020-12-05: qty 1

## 2020-12-05 MED ORDER — POTASSIUM CHLORIDE CRYS ER 20 MEQ PO TBCR
40.0000 meq | EXTENDED_RELEASE_TABLET | Freq: Once | ORAL | Status: AC
  Administered 2020-12-05: 40 meq via ORAL
  Filled 2020-12-05: qty 2

## 2020-12-05 MED ORDER — FERROUS SULFATE 325 (65 FE) MG PO TABS
325.0000 mg | ORAL_TABLET | ORAL | Status: DC
  Administered 2020-12-06 – 2020-12-10 (×3): 325 mg via ORAL
  Filled 2020-12-05 (×4): qty 1

## 2020-12-05 MED ORDER — POLYETHYLENE GLYCOL 3350 17 G PO PACK
17.0000 g | PACK | Freq: Every day | ORAL | Status: DC
  Administered 2020-12-05 – 2020-12-10 (×3): 17 g via ORAL
  Filled 2020-12-05 (×5): qty 1

## 2020-12-05 MED ORDER — BISACODYL 10 MG RE SUPP
10.0000 mg | Freq: Once | RECTAL | Status: DC
  Filled 2020-12-05: qty 1
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
## 2020-12-05 NOTE — Progress Notes (Signed)
Subjective:  Patient reports pain as mild.  No other complaints.  Objective:   VITALS:   Vitals:   12/05/20 0034 12/05/20 0424 12/05/20 0836 12/05/20 1131  BP: 114/76 107/73 114/73 117/68  Pulse: (!) 42 (!) 108 (!) 107 (!) 106  Resp: 19 17 19 17   Temp: 99.6 F (37.6 C) 98.2 F (36.8 C) 98.8 F (37.1 C) 99.2 F (37.3 C)  TempSrc:      SpO2: 98% 99% 99% 100%  Weight:      Height:        PHYSICAL EXAM:  ABD soft Sensation intact distally Dorsiflexion/Plantar flexion intact Incision: dressing C/D/I No cellulitis present Compartment soft  LABS  Results for orders placed or performed during the hospital encounter of 12/02/20 (from the past 24 hour(s))  Heparin level (unfractionated)     Status: Abnormal   Collection Time: 12/04/20 10:03 PM  Result Value Ref Range   Heparin Unfractionated 0.11 (L) 0.30 - 0.70 IU/mL  Protime-INR     Status: None   Collection Time: 12/05/20  5:50 AM  Result Value Ref Range   Prothrombin Time 14.4 11.4 - 15.2 seconds   INR 1.2 0.8 - 1.2  CBC with Differential/Platelet     Status: Abnormal   Collection Time: 12/05/20  5:50 AM  Result Value Ref Range   WBC 12.6 (H) 4.0 - 10.5 K/uL   RBC 3.17 (L) 4.22 - 5.81 MIL/uL   Hemoglobin 8.1 (L) 13.0 - 17.0 g/dL   HCT 25.2 (L) 39.0 - 52.0 %   MCV 79.5 (L) 80.0 - 100.0 fL   MCH 25.6 (L) 26.0 - 34.0 pg   MCHC 32.1 30.0 - 36.0 g/dL   RDW 15.2 11.5 - 15.5 %   Platelets 154 150 - 400 K/uL   nRBC 0.0 0.0 - 0.2 %   Neutrophils Relative % 74 %   Neutro Abs 9.4 (H) 1.7 - 7.7 K/uL   Lymphocytes Relative 11 %   Lymphs Abs 1.4 0.7 - 4.0 K/uL   Monocytes Relative 12 %   Monocytes Absolute 1.5 (H) 0.1 - 1.0 K/uL   Eosinophils Relative 1 %   Eosinophils Absolute 0.2 0.0 - 0.5 K/uL   Basophils Relative 1 %   Basophils Absolute 0.1 0.0 - 0.1 K/uL   Immature Granulocytes 1 %   Abs Immature Granulocytes 0.08 (H) 0.00 - 0.07 K/uL  Basic metabolic panel     Status: Abnormal   Collection Time: 12/05/20   5:50 AM  Result Value Ref Range   Sodium 136 135 - 145 mmol/L   Potassium 3.4 (L) 3.5 - 5.1 mmol/L   Chloride 101 98 - 111 mmol/L   CO2 27 22 - 32 mmol/L   Glucose, Bld 136 (H) 70 - 99 mg/dL   BUN 25 (H) 8 - 23 mg/dL   Creatinine, Ser 0.82 0.61 - 1.24 mg/dL   Calcium 8.6 (L) 8.9 - 10.3 mg/dL   GFR, Estimated >60 >60 mL/min   Anion gap 8 5 - 15  Heparin level (unfractionated)     Status: None   Collection Time: 12/05/20  5:50 AM  Result Value Ref Range   Heparin Unfractionated 0.37 0.30 - 0.70 IU/mL  Heparin level (unfractionated)     Status: None   Collection Time: 12/05/20 11:49 AM  Result Value Ref Range   Heparin Unfractionated 0.30 0.30 - 0.70 IU/mL    DG HIP OPERATIVE UNILAT W OR W/O PELVIS RIGHT  Result Date: 12/03/2020 CLINICAL DATA:  ORIF  right femur EXAM: RIGHT FEMUR 2 VIEWS; OPERATIVE RIGHT HIP WITH PELVIS COMPARISON:  None. FINDINGS: Multiple C-arm images show placement of a gamma nail on the right with distal locking screws. Good reduction of the inter trochanteric fracture, with anatomic alignment. IMPRESSION: ORIF right intertrochanteric fracture with gamma nail placement. Good reduction. No complicating feature. Good alignment. No unexpected finding. Electronically Signed   By: Nelson Chimes M.D.   On: 12/03/2020 16:11   DG FEMUR, MIN 2 VIEWS RIGHT  Result Date: 12/03/2020 CLINICAL DATA:  ORIF right femur EXAM: RIGHT FEMUR 2 VIEWS; OPERATIVE RIGHT HIP WITH PELVIS COMPARISON:  None. FINDINGS: Multiple C-arm images show placement of a gamma nail on the right with distal locking screws. Good reduction of the inter trochanteric fracture, with anatomic alignment. IMPRESSION: ORIF right intertrochanteric fracture with gamma nail placement. Good reduction. No complicating feature. Good alignment. No unexpected finding. Electronically Signed   By: Nelson Chimes M.D.   On: 12/03/2020 16:11    Assessment/Plan: 2 Days Post-Op   Principal Problem:   Closed right hip fracture  (HCC) Active Problems:   Hx of aortic valve replacement   Essential hypertension   Protein-calorie malnutrition, severe   Fall   Chronic back pain   Advance diet Up with therapy   Mild thigh swelling, no drainage since starting anticoagulation. Will follow his incision closely.   Continue physical therapy as medically appropriate. He may be discharged to rehab once cleared by medicine. Patient will follow up with Dr. Mack Guise in 10 to 14 days after discharge. He is weightbearing as tolerated.    Lovell Sheehan , MD 12/05/2020, 12:44 PM
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
## 2020-12-06 ENCOUNTER — Encounter: Payer: Self-pay | Admitting: Orthopedic Surgery

## 2020-12-06 DIAGNOSIS — Z952 Presence of prosthetic heart valve: Secondary | ICD-10-CM | POA: Diagnosis not present

## 2020-12-06 DIAGNOSIS — E43 Unspecified severe protein-calorie malnutrition: Secondary | ICD-10-CM | POA: Diagnosis not present

## 2020-12-06 DIAGNOSIS — I10 Essential (primary) hypertension: Secondary | ICD-10-CM | POA: Diagnosis not present

## 2020-12-06 DIAGNOSIS — S72001D Fracture of unspecified part of neck of right femur, subsequent encounter for closed fracture with routine healing: Secondary | ICD-10-CM | POA: Diagnosis not present

## 2020-12-06 LAB — BASIC METABOLIC PANEL
Anion gap: 7 (ref 5–15)
BUN: 26 mg/dL — ABNORMAL HIGH (ref 8–23)
CO2: 29 mmol/L (ref 22–32)
Calcium: 8.9 mg/dL (ref 8.9–10.3)
Chloride: 101 mmol/L (ref 98–111)
Creatinine, Ser: 0.77 mg/dL (ref 0.61–1.24)
GFR, Estimated: >60 mL/min (ref >60)
Glucose, Bld: 126 mg/dL — ABNORMAL HIGH (ref 70–99)
Potassium: 3.4 mmol/L — ABNORMAL LOW (ref 3.5–5.1)
Sodium: 137 mmol/L (ref 135–145)

## 2020-12-06 LAB — CBC
HCT: 23.4 % — ABNORMAL LOW (ref 39.0–52.0)
Hemoglobin: 7.6 g/dL — ABNORMAL LOW (ref 13.0–17.0)
MCH: 25.9 pg — ABNORMAL LOW (ref 26.0–34.0)
MCHC: 32.5 g/dL (ref 30.0–36.0)
MCV: 79.9 fL — ABNORMAL LOW (ref 80.0–100.0)
Platelets: 170 10*3/uL (ref 150–400)
RBC: 2.93 MIL/uL — ABNORMAL LOW (ref 4.22–5.81)
RDW: 15.1 % (ref 11.5–15.5)
WBC: 11.2 10*3/uL — ABNORMAL HIGH (ref 4.0–10.5)
nRBC: 0 % (ref 0.0–0.2)

## 2020-12-06 LAB — PROTIME-INR
INR: 1.1 (ref 0.8–1.2)
Prothrombin Time: 13.6 seconds (ref 11.4–15.2)

## 2020-12-06 LAB — PREPARE RBC (CROSSMATCH)

## 2020-12-06 LAB — VITAMIN B12: Vitamin B-12: 139 pg/mL — ABNORMAL LOW (ref 180–914)

## 2020-12-06 LAB — FERRITIN: Ferritin: 88 ng/mL (ref 24–336)

## 2020-12-06 LAB — HEPARIN LEVEL (UNFRACTIONATED)
Heparin Unfractionated: 0.3 IU/mL (ref 0.30–0.70)
Heparin Unfractionated: 0.32 IU/mL (ref 0.30–0.70)

## 2020-12-06 MED ORDER — SODIUM CHLORIDE 0.9% IV SOLUTION: Freq: Once | INTRAVENOUS | Status: AC

## 2020-12-06 MED ORDER — POTASSIUM CHLORIDE CRYS ER 20 MEQ PO TBCR
40.0000 meq | EXTENDED_RELEASE_TABLET | Freq: Once | ORAL | Status: AC
  Administered 2020-12-06: 40 meq via ORAL
  Filled 2020-12-06: qty 2

## 2020-12-06 MED ORDER — WARFARIN SODIUM 7.5 MG PO TABS
7.5000 mg | ORAL_TABLET | Freq: Once | ORAL | Status: AC
  Administered 2020-12-06: 7.5 mg via ORAL
  Filled 2020-12-06: qty 1

## 2020-12-06 MED ORDER — VITAMIN B-12 1000 MCG PO TABS
1000.0000 ug | ORAL_TABLET | Freq: Every day | ORAL | Status: DC
  Administered 2020-12-06 – 2020-12-10 (×5): 1000 ug via ORAL
  Filled 2020-12-06 (×5): qty 1
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
## 2020-12-06 NOTE — TOC Initial Note (Signed)
Transition of Care [facility]) - Initial/Assessment Note    Patient Details  Name: Kenneth Mccall MRN: [id number] Date of Birth: [birth year]  Transition of Care [facility]) CM/SW Contact:    Kenneth Ammons, RN Phone Number: 12/06/2020, 11:02 AM  Clinical Narrative:   RNCM attempted to meet with patient in room however he is sleeping. RNCM reached out to patient's sister Kenneth Mccall who had left message for call back. Patient was just recently at [facility] and had to leave due to being out of skilled days. Patient and sister are agreeable to skilled work up however understand that he may have to pay a copay. Patient's sister reports that Medicaid application was started while he was still at [facility].  RNCM verified PASSR, completed FL2 and started bed search.                  Expected Discharge Plan: Skilled Nursing Facility Barriers to Discharge: No Barriers Identified   Patient Goals and CMS Choice        Expected Discharge Plan and Services Expected Discharge Plan: [place]       Living arrangements for the past 2 months: [place]                                      Prior Living Arrangements/Services Living arrangements for the past 2 months: [place]   Patient language and need for interpreter reviewed:: Yes Do you feel safe going back to the place where you live?: Yes      Need for Family Participation in Patient Care: Yes (Comment) Care giver support system in place?: Yes (comment)   Criminal Activity/Legal Involvement Pertinent to Current Situation/Hospitalization: No - Comment as needed  Activities of Daily Living Home Assistive Devices/Equipment: Walker (specify type),Eyeglasses (hemi walker) ADL Screening (condition at time of admission) Patient's cognitive ability adequate to safely complete daily activities?: Yes Is the patient deaf or have difficulty hearing?: No Does the patient have difficulty seeing, even  when wearing glasses/contacts?: No Does the patient have difficulty concentrating, remembering, or making decisions?: No Patient able to express need for assistance with ADLs?: Yes Does the patient have difficulty dressing or bathing?: Yes Independently performs ADLs?: No Communication: Independent Grooming: Independent Feeding: Independent Bathing: Needs assistance Is this a change from baseline?: Pre-admission baseline Toileting: Needs assistance Is this a change from baseline?: Pre-admission baseline In/Out Bed: Needs assistance Is this a change from baseline?: Pre-admission baseline Walks in Home: Independent with device (comment) (hemi walker) Does the patient have difficulty walking or climbing stairs?: Yes Weakness of Legs: Both Weakness of Arms/Hands: Both  Permission Sought/Granted                  Emotional Assessment         Alcohol / Substance Use: Not Applicable Psych Involvement: No (comment)  Admission diagnosis:  Fall [W19.XXXA] Closed right hip fracture ([place]) [S72.001A] Closed fracture of right hip, initial encounter [facility]) [S72.001A] Patient Active Problem List   Diagnosis Date Noted  . Closed right hip fracture ([place]) 12/02/2020  . Chronic back pain   . Protein-calorie malnutrition, severe 11/15/2020  . Fall   . Hip fracture ([place]) 11/14/2020  . Constipation due to opioid therapy 11/14/2020  . Malnutrition of moderate degree 10/08/2020  . Acute distention of stomach   . Pulmonary embolus ([place])   . Nausea vomiting and diarrhea   .  Hx of aortic valve replacement   . Hypokalemia   . Hypomagnesemia   . Hypophosphatemia   . Essential hypertension   . Anemia   . 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 Closed hip fracture requiring operative repair, left, sequela 09/30/2020   PCP:  System, Provider Not In Pharmacy:   [place], [place] - [address] [place] [address], [place] [address] Phone:  [phone] Fax: [phone]  [id number] - [place], [place] - [address] [place] [address] Phone: [phone] Fax: [phone]     Social Determinants of Health (SDOH) Interventions    Readmission Risk Interventions Readmission Risk Prevention Plan 12/06/2020  Transportation Screening Complete  PCP or Specialist Appt within 3-5 Days Complete  HRI or [place] Complete  Social Work Consult for [place] Planning/Counseling Complete  Palliative Care Screening Not Applicable  Medication Review Press photographer) Complete  Some recent data might be hidden

## 2020-12-06 NOTE — Progress Notes (Signed)
PT Cancellation Note  Patient Details Name: Kenneth Mccall MRN: [id number] DOB: August 21, 1956   Cancelled Treatment:    Reason Eval/Treat Not Completed: Medical issues which prohibited therapy: Pt actively receiving blood transfusion.  Will attempt to see pt at a future date/time as medically appropriate.     Linus Salmons PT, DPT 12/06/20, 3:49 PM

## 2020-12-06 NOTE — Progress Notes (Signed)
Subjective:  POD #3 s/p intramedullary fixation for right intertrochanteric hip fracture.   Patient reports right hip pain as mild to moderate.  Patient seen in his hospital room with a male family member at the bedside.    Objective:   VITALS:   Vitals:   12/06/20 0501 12/06/20 0809 12/06/20 1153 12/06/20 1245  BP: 110/66 106/69 111/71 114/68  Pulse: 92 86 91 87  Resp: 16 18 18 17   Temp: 98.2 F (36.8 C) 98.1 F (36.7 C) 98.9 F (37.2 C) 98.8 F (37.1 C)  TempSrc: Oral Oral Oral Oral  SpO2: 98% 96% 100% 99%  Weight:      Height:        PHYSICAL EXAM: Right lower extremity Neurovascular intact Sensation intact distally Intact pulses distally Dorsiflexion/Plantar flexion intact Incision: dressing C/D/I No cellulitis present Compartment soft  LABS  Results for orders placed or performed during the hospital encounter of 12/02/20 (from the past 24 hour(s))  Heparin level (unfractionated)     Status: None   Collection Time: 12/05/20  8:32 PM  Result Value Ref Range   Heparin Unfractionated 0.34 0.30 - 0.70 IU/mL  Protime-INR     Status: None   Collection Time: 12/06/20  5:14 AM  Result Value Ref Range   Prothrombin Time 13.6 11.4 - 15.2 seconds   INR 1.1 0.8 - 1.2  CBC     Status: Abnormal   Collection Time: 12/06/20  5:14 AM  Result Value Ref Range   WBC 11.2 (H) 4.0 - 10.5 K/uL   RBC 2.93 (L) 4.22 - 5.81 MIL/uL   Hemoglobin 7.6 (L) 13.0 - 17.0 g/dL   HCT 23.4 (L) 39.0 - 52.0 %   MCV 79.9 (L) 80.0 - 100.0 fL   MCH 25.9 (L) 26.0 - 34.0 pg   MCHC 32.5 30.0 - 36.0 g/dL   RDW 15.1 11.5 - 15.5 %   Platelets 170 150 - 400 K/uL   nRBC 0.0 0.0 - 0.2 %  Basic metabolic panel     Status: Abnormal   Collection Time: 12/06/20  5:14 AM  Result Value Ref Range   Sodium 137 135 - 145 mmol/L   Potassium 3.4 (L) 3.5 - 5.1 mmol/L   Chloride 101 98 - 111 mmol/L   CO2 29 22 - 32 mmol/L   Glucose, Bld 126 (H) 70 - 99 mg/dL   BUN 26 (H) 8 - 23 mg/dL   Creatinine, Ser 0.77  0.61 - 1.24 mg/dL   Calcium 8.9 8.9 - 10.3 mg/dL   GFR, Estimated >60 >60 mL/min   Anion gap 7 5 - 15  Heparin level (unfractionated)     Status: None   Collection Time: 12/06/20  5:14 AM  Result Value Ref Range   Heparin Unfractionated 0.30 0.30 - 0.70 IU/mL  Prepare RBC (crossmatch)     Status: None   Collection Time: 12/06/20  8:57 AM  Result Value Ref Range   Order Confirmation      ORDER PROCESSED BY BLOOD BANK Performed at [facility], [address] [place], [place] [address]   Type and screen [place]     Status: None (Preliminary result)   Collection Time: 12/06/20  9:42 AM  Result Value Ref Range   ABO/RH(D) O POS    Antibody Screen NEG    Sample Expiration 12/09/2020,2359    Unit Number [id number]    Blood Component Type RBC LR PHER2    Unit division 00  Status of Unit ALLOCATED    Transfusion Status OK TO TRANSFUSE    Crossmatch Result      Compatible Performed at [facility], [address] [place], [place] [address]     No results found.  Assessment/Plan: 3 Days Post-Op   Principal Problem:   Closed right hip fracture (HCC) Active Problems:   Hx of aortic valve replacement   Essential hypertension   Protein-calorie malnutrition, severe   Fall   Chronic back pain   Patient doing well postop.  Continue with physical therapy.  Continue Coumadin.  Patient will continue heparin drip until therapeutic.   Thornton Park , MD 12/06/2020, 1:01 PM
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
## 2020-12-06 NOTE — NC FL2 (Signed)
[place] LEVEL OF CARE SCREENING TOOL     IDENTIFICATION  Patient Name: Kenneth Mccall Birthdate: 1956/07/15 Sex: male Admission Date (Current Location): 12/02/2020  [place] and [place] Number:  Engineering geologist and Address:  [facility], [address] [place], [place] [address]      Provider Number: [id number]  Attending Physician Name and Address:  Jennye Boroughs, MD  Relative Name and Phone Number:  Stevphen Meuse [phone]    Current Level of Care: Hospital Recommended Level of Care: [place] Prior Approval Number:    Date Approved/Denied:   PASRR Number: [id number]  Discharge Plan: SNF    Current Diagnoses: Patient Active Problem List   Diagnosis Date Noted  . Closed right hip fracture ([place]) 12/02/2020  . Chronic back pain   . Protein-calorie malnutrition, severe 11/15/2020  . Fall   . Hip fracture ([place]) 11/14/2020  . Constipation due to opioid therapy 11/14/2020  . Malnutrition of moderate degree 10/08/2020  . Acute distention of stomach   . Pulmonary embolus ([place])   . Nausea vomiting and diarrhea   . Hx of aortic valve replacement   . Hypokalemia   . Hypomagnesemia   . Hypophosphatemia   . Essential hypertension   . Anemia   . 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 Closed hip fracture requiring operative repair, left, sequela 09/30/2020    Orientation RESPIRATION BLADDER Height & Weight     Self,Place  Normal External catheter Weight: 77.6 kg Height:  5\' 11"  (180.3 cm)  BEHAVIORAL SYMPTOMS/MOOD NEUROLOGICAL BOWEL NUTRITION STATUS      Continent Diet (Regular)  AMBULATORY STATUS COMMUNICATION OF NEEDS Skin   Extensive Assist Verbally Normal                       Personal Care Assistance Level of Assistance  Dressing,Feeding,Bathing Bathing Assistance: Limited assistance Feeding assistance: Independent Dressing Assistance: Limited assistance     Functional Limitations  Info  Sight,Hearing,Speech Sight Info: Adequate Hearing Info: Adequate Speech Info: Adequate    SPECIAL CARE FACTORS FREQUENCY  PT (By licensed PT),OT (By licensed OT)                    Contractures Contractures Info: Not present    Additional Factors Info  Code Status,Allergies Code Status Info: Full Allergies Info: Anectine, Atenolol, Enalapril           Current Medications (12/06/2020):  This is the current hospital active medication list Current Facility-Administered Medications  Medication Dose Route Frequency Provider Last Rate Last Admin  . 0.9 %  sodium chloride infusion (Manually program via Guardrails IV Fluids)   Intravenous Once Jennye Boroughs, MD      . 0.9 %  sodium chloride infusion   Intravenous PRN Jennye Boroughs, MD 10 mL/hr at 12/04/20 0236 Restarted at 12/04/20 0236  . acetaminophen (TYLENOL) tablet 650 mg  650 mg Oral Q6H PRN Thornton Park, MD      . albuterol (VENTOLIN HFA) 108 (90 Base) MCG/ACT inhaler 2 puff  2 puff Inhalation Q6H PRN Thornton Park, MD      . alum & mag hydroxide-simeth (MAALOX/MYLANTA) 200-200-20 MG/5ML suspension 30 mL  30 mL Oral Q4H PRN Thornton Park, MD      . bisacodyl (DULCOLAX) suppository 10 mg  10 mg Rectal Once Jennye Boroughs, MD      . docusate sodium (COLACE) capsule 100 mg  100 mg Oral BID Thornton Park, MD   100  mg at 12/06/20 0911  . feeding supplement (ENSURE ENLIVE / ENSURE PLUS) liquid 237 mL  237 mL Oral TID BM Thornton Park, MD   237 mL at 12/06/20 0913  . ferrous sulfate tablet 325 mg  325 mg Oral Tonye Pearson, MD   325 mg at 12/06/20 0912  . folic acid (FOLVITE) tablet 1 mg  1 mg Oral Daily Thornton Park, MD   1 mg at 12/06/20 0913  . gabapentin (NEURONTIN) capsule 300 mg  300 mg Oral TID Thornton Park, MD   300 mg at 12/06/20 0912  . heparin ADULT infusion 100 units/mL (25000 units/260mL)  1,450 Units/hr Intravenous Continuous Jennye Boroughs, MD 14.5 mL/hr at 12/06/20 0910 1,450  Units/hr at 12/06/20 0910  . hydrALAZINE (APRESOLINE) injection 10 mg  10 mg Intravenous Q6H PRN Thornton Park, MD      . HYDROmorphone (DILAUDID) injection 0.5-1 mg  0.5-1 mg Intravenous Q4H PRN Thornton Park, MD      . losartan (COZAAR) tablet 100 mg  100 mg Oral Daily Thornton Park, MD   100 mg at 12/06/20 0911  . magnesium citrate solution 1 Bottle  1 Bottle Oral Once PRN Thornton Park, MD      . magnesium oxide (MAG-OX) tablet 800 mg  800 mg Oral Daily Thornton Park, MD   800 mg at 12/06/20 0912  . menthol-cetylpyridinium (CEPACOL) lozenge 3 mg  1 lozenge Oral PRN Thornton Park, MD       Or  . phenol (CHLORASEPTIC) mouth spray 1 spray  1 spray Mouth/Throat PRN Thornton Park, MD      . methocarbamol (ROBAXIN) tablet 500 mg  500 mg Oral Q6H PRN Thornton Park, MD   500 mg at 12/06/20 0911   Or  . methocarbamol (ROBAXIN) 500 mg in dextrose 5 % 50 mL IVPB  500 mg Intravenous Q6H PRN Thornton Park, MD      . metoCLOPramide (REGLAN) tablet 5 mg  5 mg Oral Q8H PRN Thornton Park, MD      . metoprolol succinate (TOPROL-XL) 24 hr tablet 100 mg  100 mg Oral QHS Thornton Park, MD   100 mg at 12/05/20 2107  . multivitamin with minerals tablet 1 tablet  1 tablet Oral Daily Thornton Park, MD   1 tablet at 12/06/20 0910  . ondansetron (ZOFRAN) tablet 4 mg  4 mg Oral Q6H PRN Thornton Park, MD       Or  . ondansetron [facility]) injection 4 mg  4 mg Intravenous Q6H PRN Thornton Park, MD      . oxyCODONE (Oxy IR/ROXICODONE) immediate release tablet 10-15 mg  10-15 mg Oral Q4H PRN Thornton Park, MD   10 mg at 12/06/20 0911  . oxyCODONE (Oxy IR/ROXICODONE) immediate release tablet 5-10 mg  5-10 mg Oral Q4H PRN Thornton Park, MD   10 mg at 12/05/20 1412  . pantoprazole (PROTONIX) EC tablet 40 mg  40 mg Oral BID Thornton Park, MD   40 mg at 12/06/20 0910  . polyethylene glycol (MIRALAX / GLYCOLAX) packet 17 g  17 g Oral Daily Jennye Boroughs, MD   17 g at 12/06/20  0913  . senna (SENOKOT) tablet 8.6 mg  1 tablet Oral BID Thornton Park, MD   8.6 mg at 12/06/20 0911  . thiamine tablet 100 mg  100 mg Oral Daily Thornton Park, MD   100 mg at 12/06/20 0913  . traMADol (ULTRAM) tablet 50 mg  50 mg Oral Q6H Thornton Park, MD   50 mg  at 12/06/20 0912  . traZODone (DESYREL) tablet 50 mg  50 mg Oral QHS PRN Thornton Park, MD      . vitamin B-12 (CYANOCOBALAMIN) tablet 1,000 mcg  1,000 mcg Oral Daily Jennye Boroughs, MD   1,000 mcg at 12/06/20 0913  . Vitamin D3 (Vitamin D) tablet 1,000 Units  1,000 Units Oral Daily Thornton Park, MD   1,000 Units at 12/06/20 0910  . warfarin (COUMADIN) tablet 7.5 mg  7.5 mg Oral ONCE-1600 Jennye Boroughs, MD      . Warfarin - Pharmacist Dosing Inpatient   Does not apply q1600 Thornton Park, MD   Given at 12/05/20 1630     Discharge Medications: Please see discharge summary for a list of discharge medications.  Relevant Imaging Results:  Relevant Lab Results:   Additional Information SS# [id number]  Shelbie Ammons, RN

## 2020-12-06 NOTE — Care Management Important Message (Signed)
Important Message  Patient Details  Name: Kenneth Mccall MRN: [id number] Date of Birth: 11/17/1955   Medicare Important Message Given:  Yes     [name] 12/06/2020, 2:06 PM

## 2020-12-06 NOTE — Plan of Care (Signed)
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
## 2020-12-07 DIAGNOSIS — S72001D Fracture of unspecified part of neck of right femur, subsequent encounter for closed fracture with routine healing: Secondary | ICD-10-CM | POA: Diagnosis not present

## 2020-12-07 DIAGNOSIS — Z952 Presence of prosthetic heart valve: Secondary | ICD-10-CM | POA: Diagnosis not present

## 2020-12-07 LAB — PROTIME-INR
INR: 1.3 — ABNORMAL HIGH (ref 0.8–1.2)
Prothrombin Time: 15.9 seconds — ABNORMAL HIGH (ref 11.4–15.2)

## 2020-12-07 LAB — BASIC METABOLIC PANEL
Anion gap: 7 (ref 5–15)
BUN: 20 mg/dL (ref 8–23)
CO2: 27 mmol/L (ref 22–32)
Calcium: 8.9 mg/dL (ref 8.9–10.3)
Chloride: 102 mmol/L (ref 98–111)
Creatinine, Ser: 0.58 mg/dL — ABNORMAL LOW (ref 0.61–1.24)
GFR, Estimated: >60 mL/min (ref >60)
Glucose, Bld: 116 mg/dL — ABNORMAL HIGH (ref 70–99)
Potassium: 3.7 mmol/L (ref 3.5–5.1)
Sodium: 136 mmol/L (ref 135–145)

## 2020-12-07 LAB — PREPARE RBC (CROSSMATCH)

## 2020-12-07 LAB — CBC
HCT: 23.9 % — ABNORMAL LOW (ref 39.0–52.0)
Hemoglobin: 7.7 g/dL — ABNORMAL LOW (ref 13.0–17.0)
MCH: 25.9 pg — ABNORMAL LOW (ref 26.0–34.0)
MCHC: 32.2 g/dL (ref 30.0–36.0)
MCV: 80.5 fL (ref 80.0–100.0)
Platelets: 176 10*3/uL (ref 150–400)
RBC: 2.97 MIL/uL — ABNORMAL LOW (ref 4.22–5.81)
RDW: 14.9 % (ref 11.5–15.5)
WBC: 9.5 10*3/uL (ref 4.0–10.5)
nRBC: 0 % (ref 0.0–0.2)

## 2020-12-07 LAB — MAGNESIUM: Magnesium: 1.4 mg/dL — ABNORMAL LOW (ref 1.7–2.4)

## 2020-12-07 LAB — HEPARIN LEVEL (UNFRACTIONATED): Heparin Unfractionated: 0.31 IU/mL (ref 0.30–0.70)

## 2020-12-07 LAB — PHOSPHORUS: Phosphorus: 3.1 mg/dL (ref 2.5–4.6)

## 2020-12-07 MED ORDER — WARFARIN SODIUM 5 MG PO TABS
5.0000 mg | ORAL_TABLET | Freq: Once | ORAL | Status: AC
  Administered 2020-12-07: 5 mg via ORAL
  Filled 2020-12-07: qty 1

## 2020-12-07 MED ORDER — MAGNESIUM SULFATE 4 GM/100ML IV SOLN
4.0000 g | Freq: Once | INTRAVENOUS | Status: AC
  Administered 2020-12-07: 4 g via INTRAVENOUS
  Filled 2020-12-07: qty 100

## 2020-12-07 MED ORDER — SODIUM CHLORIDE 0.9% IV SOLUTION: Freq: Once | INTRAVENOUS | Status: AC
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
## 2020-12-07 NOTE — Progress Notes (Signed)
Subjective:  POD #4 s/p intramedullary fixation for right intertrochanteric hip fracture.   Patient reports right lower extremity pain as moderate.  Patient seems to be somewhat confused tonight and is asking for help to leave.  He has difficulty describing how he did today with physical therapy but believes he got out of bed.  Patient is stating that he is having pain in the right lower extremity from the top of his hip to his ankle.  Patient received a second unit of packed red blood cells today after his hemoglobin went from 7.6 to 7.7 after 1 unit yesterday.  Objective:   VITALS:   Vitals:   12/07/20 1230 12/07/20 1502 12/07/20 1540 12/07/20 1557  BP: 118/76 126/76 122/68 115/74  Pulse: 78 91 88 88  Resp: 16 18 17 16   Temp: 98.2 F (36.8 C) 98.7 F (37.1 C) 98.4 F (36.9 C) 98.7 F (37.1 C)  TempSrc: Oral Oral Oral   SpO2: 95% 98% 99% 97%  Weight:      Height:        PHYSICAL EXAM: Right lower extremity Neurovascular intact Sensation intact distally Intact pulses distally Dorsiflexion/Plantar flexion intact Incision: scant drainage No cellulitis present Compartment soft  LABS  Results for orders placed or performed during the hospital encounter of 12/02/20 (from the past 24 hour(s))  Protime-INR     Status: Abnormal   Collection Time: 12/07/20  6:14 AM  Result Value Ref Range   Prothrombin Time 15.9 (H) 11.4 - 15.2 seconds   INR 1.3 (H) 0.8 - 1.2  CBC     Status: Abnormal   Collection Time: 12/07/20  6:14 AM  Result Value Ref Range   WBC 9.5 4.0 - 10.5 K/uL   RBC 2.97 (L) 4.22 - 5.81 MIL/uL   Hemoglobin 7.7 (L) 13.0 - 17.0 g/dL   HCT 23.9 (L) 39.0 - 52.0 %   MCV 80.5 80.0 - 100.0 fL   MCH 25.9 (L) 26.0 - 34.0 pg   MCHC 32.2 30.0 - 36.0 g/dL   RDW 14.9 11.5 - 15.5 %   Platelets 176 150 - 400 K/uL   nRBC 0.0 0.0 - 0.2 %  Basic metabolic panel     Status: Abnormal   Collection Time: 12/07/20  6:14 AM  Result Value Ref Range   Sodium 136 135 - 145 mmol/L    Potassium 3.7 3.5 - 5.1 mmol/L   Chloride 102 98 - 111 mmol/L   CO2 27 22 - 32 mmol/L   Glucose, Bld 116 (H) 70 - 99 mg/dL   BUN 20 8 - 23 mg/dL   Creatinine, Ser 0.58 (L) 0.61 - 1.24 mg/dL   Calcium 8.9 8.9 - 10.3 mg/dL   GFR, Estimated >60 >60 mL/min   Anion gap 7 5 - 15  Magnesium     Status: Abnormal   Collection Time: 12/07/20  6:14 AM  Result Value Ref Range   Magnesium 1.4 (L) 1.7 - 2.4 mg/dL  Phosphorus     Status: None   Collection Time: 12/07/20  6:14 AM  Result Value Ref Range   Phosphorus 3.1 2.5 - 4.6 mg/dL  Heparin level (unfractionated)     Status: None   Collection Time: 12/07/20  6:14 AM  Result Value Ref Range   Heparin Unfractionated 0.31 0.30 - 0.70 IU/mL  Prepare RBC (crossmatch)     Status: None   Collection Time: 12/07/20  7:42 AM  Result Value Ref Range   Order Confirmation  ORDER PROCESSED BY BLOOD BANK Performed at [facility], [address] [place], [place] [address]     No results found.  Assessment/Plan: 4 Days Post-Op   Principal Problem:   Closed right hip fracture (HCC) Active Problems:   Hx of aortic valve replacement   Essential hypertension   Protein-calorie malnutrition, severe   Fall   Chronic back pain  Continue current pain management.  Patient has been given bed offers and case management is waiting to hear from the patient and his family which one he would like to be discharged to.  Continue physical therapy.  Recheck CBC in the a.m.  Patient has an INR of 1.3 today.  Patient is on Coumadin at baseline and is currently on a heparin drip as a bridge until his INR is therapeutic.  Patient will follow up with Dr. Mack Guise in 10 to 14 days after discharge from the hospital for reevaluation, staple removal and x-ray.  Patient will need to follow-up with his primary care physician regarding Coumadin management after discharge.   Thornton Park , MD 12/07/2020, 7:08 PM

## 2020-12-07 NOTE — TOC Progression Note (Signed)
Transition of Care [facility]) - Progression Note    Patient Details  Name: Kenneth Mccall MRN: [id number] Date of Birth: November 29, 1955  Transition of Care [facility]) CM/SW Jenkins, RN Phone Number: 12/07/2020, 2:52 PM  Clinical Narrative:   RNCM met with patient and sister in room. Bed offers for [facility], [facility], [facility] and [facility] were presented. Patient will discuss with his family and then let staff know his choice.     Expected Discharge Plan: [place] Barriers to Discharge: No Barriers Identified  Expected Discharge Plan and Services Expected Discharge Plan: [place] arrangements for the past 2 months: [place]                                       Social Determinants of Health (SDOH) Interventions    Readmission Risk Interventions Readmission Risk Prevention Plan 12/06/2020  Transportation Screening Complete  PCP or Specialist Appt within 3-5 Days Complete  HRI or [place] Complete  Social Work Consult for [place] Planning/Counseling Complete  Palliative Care Screening Not Applicable  Medication Review Press photographer) Complete  Some recent data might be hidden

## 2020-12-07 NOTE — Anesthesia Postprocedure Evaluation (Signed)
Anesthesia Post Note  Patient: Tywan Siever  Procedure(s) Performed: INTRAMEDULLARY (IM) NAIL INTERTROCHANTRIC (Right Hip)  Patient location during evaluation: PACU Anesthesia Type: General Level of consciousness: awake and alert Pain management: pain level controlled Vital Signs Assessment: post-procedure vital signs reviewed and stable Respiratory status: spontaneous breathing, nonlabored ventilation, respiratory function stable and patient connected to nasal cannula oxygen Cardiovascular status: stable and blood pressure returned to baseline Postop Assessment: no apparent nausea or vomiting Anesthetic complications: no   No complications documented.   Last Vitals:  Vitals:   12/07/20 1540 12/07/20 1557  BP: 122/68 115/74  Pulse: 88 88  Resp: 17 16  Temp: 36.9 C 37.1 C  SpO2: 99% 97%    Last Pain:  Vitals:   12/07/20 1540  TempSrc: Oral  PainSc: 10-Worst pain ever                 Molli Barrows
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
## 2020-12-07 NOTE — Progress Notes (Signed)
Physical Therapy Treatment Patient Details Name: Kenneth Mccall MRN: [id number] DOB: 1956/02/02 Today's Date: 12/07/2020    History of Present Illness Pt admitted to [facility] on 12/02/20 for eval of R hip pain following a mechanical fall. Pt with recent L hip IM nailing on 10/01/20. 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 Pt recently DC from hospital on 11/18/20 for nondisplaced L femoral shaft fx (addressed with conservative management) for rehab to continue PT, but was DC home because he does not have a pay source. Pt s/p R IM nailing on 12/03/20 by Dr. Mack Guise.    PT Comments    Pt was pleasant and motivated to participate during the session.  Pt put forth very good effort during the session but required extensive physical assistance with all functional tasks.  Pt required cuing for hand placement and increased trunk flexion to facilitate improved transfer performance but even with improved sequencing the pt required extensive +2 assist to come to standing.  Once in standing pt required assistance for stability and was unable to advance either LE during amb attempt.  Pt will benefit from PT services in a SNF setting upon discharge to safely address deficits listed in patient problem list for decreased caregiver assistance and eventual return to PLOF.     Follow Up Recommendations  SNF     Equipment Recommendations  Other (comment) (TBD)    Recommendations for Other Services       Precautions / Restrictions Precautions Precautions: Fall Restrictions Weight Bearing Restrictions: Yes RLE Weight Bearing: Weight bearing as tolerated    Mobility  Bed Mobility Overal bed mobility: Needs Assistance Bed Mobility:  Supine to Sit;Sit to Supine     Supine to sit: +2 for physical assistance;Max assist Sit to supine: +2 for physical assistance;Max assist   General bed mobility comments: Pt required extensive assist for BLE and trunk control    Transfers Overall transfer level: Needs assistance Equipment used: Hemi-walker Transfers: Sit to/from Stand Sit to Stand: Mod assist;+2 physical assistance;Max assist         General transfer comment: Mod to max verbal and tactile cues for hand placement and increased trunk flexion  Ambulation/Gait         Gait velocity: decreased   General Gait Details: Pt unable to advance either LE in standing   Stairs             Wheelchair Mobility    Modified Rankin (Stroke Patients Only)       Balance Overall balance assessment: Needs assistance Sitting-balance support: Feet supported;Single extremity supported Sitting balance-Leahy Scale: Fair Sitting balance - Comments: Left lateral lean secondary to pain with occasional min assist required for stability Postural control: Left lateral lean Standing balance support: During functional activity;Single extremity supported Standing balance-Leahy Scale: Poor Standing balance comment: Mod A required for static standing balance                            Cognition Arousal/Alertness: Awake/alert Behavior During Therapy: WFL for tasks assessed/performed Overall Cognitive Status: History of cognitive impairments - at baseline  Exercises Total Joint Exercises Hip ABduction/ADduction: Strengthening;AAROM;Both;10 reps Straight Leg Raises: AAROM;Both;10 reps Long Arc Quad: AAROM;Strengthening;Both;10 reps Knee Flexion: AROM;Strengthening;Both;10 reps    General Comments        Pertinent Vitals/Pain Pain Assessment: 0-10 Pain Score: 7  Pain Location: R hip Pain Descriptors / Indicators: Sore;Aching Pain Intervention(s):  Premedicated before session;Monitored during session;Repositioned    Home Living                      Prior Function            PT Goals (current goals can now be found in the care plan section) Progress towards PT goals: Progressing toward goals    Frequency    7X/week      PT Plan Frequency needs to be updated    Co-evaluation              AM-PAC PT "6 Clicks" Mobility   Outcome Measure  Help needed turning from your back to your side while in a flat bed without using bedrails?: A Lot Help needed moving from lying on your back to sitting on the side of a flat bed without using bedrails?: A Lot Help needed moving to and from a bed to a chair (including a wheelchair)?: A Lot Help needed standing up from a chair using your arms (e.g., wheelchair or bedside chair)?: A Lot Help needed to walk in hospital room?: Total Help needed climbing 3-5 steps with a railing? : Total 6 Click Score: 10    End of Session Equipment Utilized During Treatment: Gait belt Activity Tolerance: Patient limited by pain Patient left: with call bell/phone within reach;with bed alarm set;in bed;with nursing/sitter in room Nurse Communication: Mobility status PT Visit Diagnosis: Unsteadiness on feet (R26.81);History of falling (Z91.81);Repeated falls (R29.6);Difficulty in walking, not elsewhere classified (R26.2);Muscle weakness (generalized) (M62.81);Pain Pain - Right/Left: Right Pain - part of body: Hip     Time: 1030-1053 PT Time Calculation (min) (ACUTE ONLY): 23 min  Charges:  $Therapeutic Exercise: 8-22 mins $Therapeutic Activity: 8-22 mins                     D. Scott Rayvon Brandvold PT, DPT 12/07/20, 1:45 PM

## 2020-12-08 DIAGNOSIS — D62 Acute posthemorrhagic anemia: Secondary | ICD-10-CM

## 2020-12-08 LAB — TYPE AND SCREEN
ABO/RH(D): O POS
Antibody Screen: NEGATIVE
Unit division: 0
Unit division: 0

## 2020-12-08 LAB — CBC
HCT: 28.3 % — ABNORMAL LOW (ref 39.0–52.0)
Hemoglobin: 9 g/dL — ABNORMAL LOW (ref 13.0–17.0)
MCH: 26.8 pg (ref 26.0–34.0)
MCHC: 31.8 g/dL (ref 30.0–36.0)
MCV: 84.2 fL (ref 80.0–100.0)
Platelets: 194 10*3/uL (ref 150–400)
RBC: 3.36 MIL/uL — ABNORMAL LOW (ref 4.22–5.81)
RDW: 15.2 % (ref 11.5–15.5)
WBC: 9.8 10*3/uL (ref 4.0–10.5)
nRBC: 0 % (ref 0.0–0.2)

## 2020-12-08 LAB — BASIC METABOLIC PANEL
Anion gap: 8 (ref 5–15)
BUN: 17 mg/dL (ref 8–23)
CO2: 26 mmol/L (ref 22–32)
Calcium: 8.8 mg/dL — ABNORMAL LOW (ref 8.9–10.3)
Chloride: 103 mmol/L (ref 98–111)
Creatinine, Ser: 0.59 mg/dL — ABNORMAL LOW (ref 0.61–1.24)
GFR, Estimated: >60 mL/min (ref >60)
Glucose, Bld: 106 mg/dL — ABNORMAL HIGH (ref 70–99)
Potassium: 3.4 mmol/L — ABNORMAL LOW (ref 3.5–5.1)
Sodium: 137 mmol/L (ref 135–145)

## 2020-12-08 LAB — BPAM RBC
Blood Product Expiration Date: 202204062359
Blood Product Expiration Date: 202204082359
ISSUE DATE / TIME: 202203071336
ISSUE DATE / TIME: 202203081206
Unit Type and Rh: 5100
Unit Type and Rh: 5100

## 2020-12-08 LAB — PROTIME-INR
INR: 1.4 — ABNORMAL HIGH (ref 0.8–1.2)
Prothrombin Time: 16.4 seconds — ABNORMAL HIGH (ref 11.4–15.2)

## 2020-12-08 LAB — HEPARIN LEVEL (UNFRACTIONATED)
Heparin Unfractionated: 0.34 IU/mL (ref 0.30–0.70)
Heparin Unfractionated: 0.25 IU/mL — ABNORMAL LOW (ref 0.30–0.70)
Heparin Unfractionated: 0.43 IU/mL (ref 0.30–0.70)

## 2020-12-08 LAB — MAGNESIUM: Magnesium: 1.6 mg/dL — ABNORMAL LOW (ref 1.7–2.4)

## 2020-12-08 MED ORDER — WARFARIN SODIUM 5 MG PO TABS
5.0000 mg | ORAL_TABLET | Freq: Once | ORAL | Status: AC
  Administered 2020-12-08: 5 mg via ORAL
  Filled 2020-12-08: qty 1

## 2020-12-08 MED ORDER — HEPARIN BOLUS VIA INFUSION
1100.0000 [IU] | Freq: Once | INTRAVENOUS | Status: AC
  Administered 2020-12-08: 1100 [IU] via INTRAVENOUS
  Filled 2020-12-08: qty 1100

## 2020-12-08 MED ORDER — POTASSIUM CHLORIDE CRYS ER 20 MEQ PO TBCR
20.0000 meq | EXTENDED_RELEASE_TABLET | Freq: Once | ORAL | Status: AC
  Administered 2020-12-08: 20 meq via ORAL
  Filled 2020-12-08: qty 1

## 2020-12-08 MED ORDER — MAGNESIUM SULFATE 2 GM/50ML IV SOLN
2.0000 g | Freq: Once | INTRAVENOUS | Status: AC
  Administered 2020-12-08: 2 g via INTRAVENOUS
  Filled 2020-12-08: qty 50

## 2020-12-08 NOTE — Plan of Care (Signed)

## 2020-12-08 NOTE — Progress Notes (Signed)
Subjective:  POD #5 s/p intramedullary fixation for right intertrochanteric hip fracture.   Patient reports right hip and lower extremity pain as mild.  Patient states his right lower extremity pain has improved compared to yesterday when it was radiating from his hip to his ankle but the symptoms are still there.  Male family member is at the bedside.  Objective:   VITALS:   Vitals:   12/08/20 0018 12/08/20 0506 12/08/20 0816 12/08/20 1313  BP: (!) 141/79 (!) 147/84 (!) 153/92 138/83  Pulse: 74 71 72 84  Resp: 18  16 16   Temp: 98 F (36.7 C) 98.1 F (36.7 C) 98 F (36.7 C) 98.3 F (36.8 C)  TempSrc:      SpO2: 100% 99% 97% 100%  Weight:      Height:        PHYSICAL EXAM: Right lower extremity Neurovascular intact Sensation intact distally Intact pulses distally Dorsiflexion/Plantar flexion intact Incision: scant drainage No cellulitis present Compartment soft  LABS  Results for orders placed or performed during the hospital encounter of 12/02/20 (from the past 24 hour(s))  Protime-INR     Status: Abnormal   Collection Time: 12/08/20  6:03 AM  Result Value Ref Range   Prothrombin Time 16.4 (H) 11.4 - 15.2 seconds   INR 1.4 (H) 0.8 - 1.2  CBC     Status: Abnormal   Collection Time: 12/08/20  6:03 AM  Result Value Ref Range   WBC 9.8 4.0 - 10.5 K/uL   RBC 3.36 (L) 4.22 - 5.81 MIL/uL   Hemoglobin 9.0 (L) 13.0 - 17.0 g/dL   HCT 28.3 (L) 39.0 - 52.0 %   MCV 84.2 80.0 - 100.0 fL   MCH 26.8 26.0 - 34.0 pg   MCHC 31.8 30.0 - 36.0 g/dL   RDW 15.2 11.5 - 15.5 %   Platelets 194 150 - 400 K/uL   nRBC 0.0 0.0 - 0.2 %  Heparin level (unfractionated)     Status: None   Collection Time: 12/08/20  6:03 AM  Result Value Ref Range   Heparin Unfractionated 0.34 0.30 - 0.70 IU/mL  Basic metabolic panel     Status: Abnormal   Collection Time: 12/08/20  6:03 AM  Result Value Ref Range   Sodium 137 135 - 145 mmol/L   Potassium 3.4 (L) 3.5 - 5.1 mmol/L   Chloride 103 98 -  111 mmol/L   CO2 26 22 - 32 mmol/L   Glucose, Bld 106 (H) 70 - 99 mg/dL   BUN 17 8 - 23 mg/dL   Creatinine, Ser 0.59 (L) 0.61 - 1.24 mg/dL   Calcium 8.8 (L) 8.9 - 10.3 mg/dL   GFR, Estimated >60 >60 mL/min   Anion gap 8 5 - 15  Magnesium     Status: Abnormal   Collection Time: 12/08/20  6:03 AM  Result Value Ref Range   Magnesium 1.6 (L) 1.7 - 2.4 mg/dL    No results found.  Assessment/Plan: 5 Days Post-Op   Principal Problem:   Closed right hip fracture (HCC) Active Problems:   Hx of aortic valve replacement   Essential hypertension   Protein-calorie malnutrition, severe   Fall   Chronic back pain  The patient is stable postop from an orthopedic standpoint.  He will require continued physical therapy as he requires assistance for standing due to issues with balance.  He will require extensive physical therapy and will require skilled nursing facility upon discharge.  Patient will follow up with  Dr. Mack Guise in the office in 10 to 14 days after discharge from the hospital.  Patient will continue his baseline Coumadin which will cover him for DVT prophylaxis.  Patient's hemoglobin has improved today after second unit of PRBCs yesterday.   Thornton Park , MD 12/08/2020, 1:40 PM
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
## 2020-12-08 NOTE — Progress Notes (Signed)
Nutrition Follow Up Note   DOCUMENTATION CODES:   Severe malnutrition in context of social or environmental circumstances  INTERVENTION:   Ensure Enlive po TID, each supplement provides 350 kcal and 20 grams of protein  Magic cup TID with meals, each supplement provides 290 kcal and 9 grams of protein  MVI, thiamine and folic acid po daily   NUTRITION DIAGNOSIS:   Severe Malnutrition related to social / environmental circumstances (ETOH abuse) as evidenced by moderate fat depletion,severe fat depletion,severe muscle depletion.  GOAL:   Patient will meet greater than or equal to 90% of their needs  -not met   MONITOR:   PO intake,Supplement acceptance,Labs,Weight trends,Skin,I & O's  ASSESSMENT:   65 y.o. male with medical history significant for  hypertension, anxiety, history of alcohol abuse, history of aortic dissection status post repair, aortic valve repair in 2012 now with mechanical valve (aortic valve replacement on 03/13/2011) on warfarin, history of Wernicke's encephalopathy, frequent falls, history of subdural hematoma, atrial flutter, meningioma in November 2010, horseshoe kidney, hepatitis C diagnosed in 2018, testicular hypofunction, s/p left hip IM nailing on 10/01/20 who presents with hip fracture after fall.   Pt s/p intramedullary fixation 3/4  Pt with improved appetite and oral intake; pt eating 100% of meals today. Pt is drinking some Ensure but is also refusing some. Pt receiving Magic Cups on meal trays. No new weight since 3/3; RD will request weekly weights. Plan is for SNF at discharge.    Medications reviewed and include: dulcolax, colace, ferrous sulfate, folic acid, Mg oxide, MVI, protonix, miralax, senokot, thiamine, tramadol, D3, B12, warfarin  Labs reviewed: K 3.4(L), creat 0.59(L), Mg 1.6(L) P 3.1 wnl- 3/8 Vitamin D- 20.51(L)- 2/14 B12- 139(L)- 3/7 Hgb 9.0(L), Hct 28.3(L)  Diet Order:   Diet Order            Diet regular Room service  appropriate? Yes; Fluid consistency: Thin  Diet effective now                EDUCATION NEEDS:   Education needs have been addressed  Skin:  Skin Assessment: Reviewed RN Assessment  Last BM:  3/7  Height:   Ht Readings from Last 1 Encounters:  12/02/20 '5\' 11"'  (1.803 m)    Weight:   Wt Readings from Last 1 Encounters:  12/02/20 77.6 kg    Ideal Body Weight:  78 kg  BMI:  Body mass index is 23.85 kg/m.  Estimated Nutritional Needs:   Kcal:  2100-2400kcal/day  Protein:  105-115g/day  Fluid:  2.0-2.3L/day  Koleen Distance MS, RD, LDN Please refer to [facility] for RD and/or RD on-call/weekend/after hours pager

## 2020-12-08 NOTE — Progress Notes (Signed)
OT Cancellation Note  Patient Details Name: Kenneth Mccall MRN: [id number] DOB: [birth year]   Cancelled Treatment:    Reason Eval/Treat Not Completed: Other (comment). PT currently working with PT. OT to re-attempt when pt is next available.   Darleen Crocker, [place], OTR/L , [place] [phone]  12/08/20, 2:53 PM  12/08/2020, 2:53 PM
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
## 2020-12-08 NOTE — Progress Notes (Signed)
Physical Therapy Treatment Patient Details Name: Kenneth Mccall MRN: [id number] DOB: 07-15-1956 Today's Date: 12/08/2020    History of Present Illness Pt admitted to [facility] on 12/[facility]20 for eval of R hip pain following a mechanical fall. Pt with recent L hip IM nailing on 10/01/20. 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 Pt recently DC from hospital on 11/18/20 for nondisplaced L femoral shaft fx (addressed with conservative management) for rehab to continue PT, but was DC home because he does not have a pay source. Pt s/p R IM nailing on 12/03/20 by Dr. Mack Guise.    PT Comments    Pt was pleasant and motivated to participate during the session.  Pt continued to require extensive physical assistance with all functional tasks but did make small progress towards goals this session.  Pt was able to take several small shuffling steps at the EOB and required grossly less physical assistance with RLE mobility during therex.  Pt reported no adverse symptoms during the session other than R hip pain. Pt will benefit from PT services in a SNF setting upon discharge to safely address deficits listed in patient problem list for decreased caregiver assistance and eventual return to PLOF.     Follow Up Recommendations  SNF     Equipment Recommendations  Other (comment) (TBD)    Recommendations for Other Services       Precautions / Restrictions Precautions Precautions: Fall Restrictions Weight Bearing Restrictions: Yes RLE Weight Bearing: Weight bearing as tolerated    Mobility  Bed Mobility Overal bed mobility: Needs Assistance Bed Mobility: Supine to Sit;Sit to Supine     Supine to sit: +2 for physical  assistance;Max assist Sit to supine: +2 for physical assistance;Max assist   General bed mobility comments: Pt required extensive assist for BLE and trunk control    Transfers Overall transfer level: Needs assistance Equipment used: Hemi-walker Transfers: Sit to/from Stand Sit to Stand: Mod assist;+2 physical assistance         General transfer comment: Mod to max verbal and tactile cues for hand placement and increased trunk flexion  Ambulation/Gait Ambulation/Gait assistance: Mod assist;+2 safety/equipment Gait Distance (Feet): 1 Feet x 2 Assistive device: Hemi-walker Gait Pattern/deviations: Step-to pattern;Decreased step length - right;Decreased step length - left;Antalgic;Decreased stance time - right;Shuffle Gait velocity: decreased   General Gait Details: Pt able to take several very small, effortful, shuffling steps with mod A for stability   Stairs             Wheelchair Mobility    Modified Rankin (Stroke Patients Only)       Balance Overall balance assessment: Needs assistance Sitting-balance support: Feet supported;Single extremity supported Sitting balance-Leahy Scale: Fair Sitting balance - Comments: Left lateral lean secondary to pain with occasional min assist required for stability Postural control: Left lateral lean Standing balance support: During functional activity;Single extremity supported Standing balance-Leahy Scale: Poor Standing balance comment: Mod A required for static standing balance                            Cognition Arousal/Alertness: Awake/alert Behavior During Therapy: WFL for tasks assessed/performed Overall Cognitive Status: History of cognitive impairments - at baseline  Exercises Total Joint Exercises Ankle Circles/Pumps: AROM;Strengthening;Both;10 reps;15 reps Quad Sets: Strengthening;Both;10 reps;15 reps Gluteal Sets: Strengthening;Both;10 reps;15  reps Hip ABduction/ADduction: Strengthening;AAROM;Both;10 reps;5 reps Straight Leg Raises: AAROM;Both;10 reps;Strengthening;5 reps Long Arc Quad: AAROM;Strengthening;Both;10 reps;5 reps Knee Flexion: Strengthening;Both;10 reps;AAROM;5 reps Marching in Standing: Strengthening;Standing;AROM;Right;5 reps Anterior weight shifting in sitting   General Comments        Pertinent Vitals/Pain Pain Assessment: 0-10 Pain Score: 7  Pain Location: R hip Pain Descriptors / Indicators: Sore;Aching Pain Intervention(s): Monitored during session;RN gave pain meds during session;Patient requesting pain meds-RN notified;Repositioned    Home Living                      Prior Function            PT Goals (current goals can now be found in the care plan section) Progress towards PT goals: Progressing toward goals    Frequency    7X/week      PT Plan Frequency needs to be updated    Co-evaluation              AM-PAC PT "6 Clicks" Mobility   Outcome Measure  Help needed turning from your back to your side while in a flat bed without using bedrails?: A Lot Help needed moving from lying on your back to sitting on the side of a flat bed without using bedrails?: A Lot Help needed moving to and from a bed to a chair (including a wheelchair)?: A Lot Help needed standing up from a chair using your arms (e.g., wheelchair or bedside chair)?: A Lot Help needed to walk in hospital room?: Total Help needed climbing 3-5 steps with a railing? : Total 6 Click Score: 10    End of Session Equipment Utilized During Treatment: Gait belt Activity Tolerance: Patient limited by pain Patient left: with call bell/phone within reach;with bed alarm set;in bed;with nursing/sitter in room;with family/visitor present Nurse Communication: Mobility status PT Visit Diagnosis: Unsteadiness on feet (R26.81);History of falling (Z91.81);Repeated falls (R29.6);Difficulty in walking, not elsewhere classified  (R26.2);Muscle weakness (generalized) (M62.81);Pain Pain - Right/Left: Right Pain - part of body: Hip     Time: 8280-0349 PT Time Calculation (min) (ACUTE ONLY): 41 min  Charges:  $Gait Training: 8-22 mins $Therapeutic Exercise: 8-22 mins $Therapeutic Activity: 8-22 mins                     D. Scott Jerek Meulemans PT, DPT 12/08/20, 3:25 PM
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
## 2020-12-08 NOTE — Progress Notes (Addendum)
PROGRESS NOTE    Kenneth Mccall  UXL:[id number] DOB: 07/05/1956 DOA: 12/02/2020 PCP: System, Provider Not In    Assessment & Plan:   Principal Problem:   Closed right hip fracture ([place]) Active Problems:   Hx of aortic valve replacement   Essential hypertension   Protein-calorie malnutrition, severe   Fall   Chronic back pain   Acute intertrochanteric fracture of right hip: s/p mechanical fall. S/p intramedullary fixation of right intertrochanteric hip fracture on 12/03/2020. PT recs SNF.   Acute postoperative blood loss anemia: w/ IDA. S/p 2 units of pRBCs transfused. Continue on iron supplements   Hypokalemia: KCl repleted. Will continue to monitor   Hypomagnesemia: mg sulfate ordered. Will continue to monitor   Hx of aortic valve repair: in 2012. Continue coumadin and IV heparin for bridging. INR is subtherapeutic. Monitor INR daily  HTN: continue on losartan, metoprolol   Constipation: continue on senna  Atrial flutter: unknown typical vs atypical. Continue on metoprolol, coumadin   DVT prophylaxis: coumadin  Code Status: full  Family Communication:  Disposition Plan: SNF  Level of care: Med-Surg   Status is: Inpatient  Remains inpatient appropriate because:Unsafe d/c plan, IV treatments appropriate due to intensity of illness or inability to take PO and Inpatient level of care appropriate due to severity of illness   Dispo: The patient is from: Home              Anticipated d/c is to: SNF              Patient currently is not medically stable to d/c.   Difficult to place patient Yes    Consultants:   Ortho surg    Procedures:    Antimicrobials:    Subjective: Pt hip pain   Objective: Vitals:   12/07/20 2007 12/08/20 0018 12/08/20 0506 12/08/20 0816  BP: 127/83 (!) 141/79 (!) 147/84 (!) 153/92  Pulse: 86 74 71 72  Resp: 17 18  16   Temp: 98.3 F (36.8 C) 98 F (36.7 C) 98.1 F (36.7 C) 98 F (36.7 C)  TempSrc: Oral     SpO2: 98% 100%  99% 97%  Weight:      Height:        Intake/Output Summary (Last 24 hours) at 12/08/2020 0825 Last data filed at 12/08/2020 0416 Gross per 24 hour  Intake 1957.6 ml  Output 500 ml  Net 1457.6 ml   Filed Weights   12/02/20 0549  Weight: 77.6 kg    Examination:  General exam: Appears calm and comfortable  Respiratory system: Clear to auscultation. Respiratory effort normal. Cardiovascular system: S1 & S2+. No  rubs, gallops or clicks. Gastrointestinal system: Abdomen is nondistended, soft and nontender. Hypoactive bowel sounds heard. Central nervous system: Alert and oriented.  Psychiatry: Judgement and insight appear normal. Mood & affect appropriate.     Data Reviewed: I have personally reviewed following labs and imaging studies  CBC: Recent Labs  Lab 12/04/20 0454 12/05/20 0550 12/06/20 0514 12/07/20 0614 12/08/20 0603  WBC 16.3* 12.6* 11.2* 9.5 9.8  NEUTROABS 13.6* 9.4*  --   --   --   HGB 8.8* 8.1* 7.6* 7.7* 9.0*  HCT 28.2* 25.2* 23.4* 23.9* 28.3*  MCV 81.0 79.5* 79.9* 80.5 84.2  PLT 150 154 170 176 536   Basic Metabolic Panel: Recent Labs  Lab 12/04/20 0454 12/05/20 0550 12/06/20 0514 12/07/20 0614 12/08/20 0603  NA 136 136 137 136 137  K 3.6 3.4* 3.4* 3.7 3.4*  CL 101  101 101 102 103  CO2 29 27 29 27 26   GLUCOSE 133* 136* 126* 116* 106*  BUN 19 25* 26* 20 17  CREATININE 0.70 0.82 0.77 0.58* 0.59*  CALCIUM 8.4* 8.6* 8.9 8.9 8.8*  MG  --   --   --  1.4* 1.6*  PHOS  --   --   --  3.1  --    GFR: Estimated Creatinine Clearance: 96.7 mL/min (A) (by C-G formula based on SCr of 0.59 mg/dL (L)). Liver Function Tests: No results for input(s): AST, ALT, ALKPHOS, BILITOT, PROT, ALBUMIN in the last 168 hours. No results for input(s): LIPASE, AMYLASE in the last 168 hours. No results for input(s): AMMONIA in the last 168 hours. Coagulation Profile: Recent Labs  Lab 12/04/20 0454 12/05/20 0550 12/06/20 0514 12/07/20 0614 12/08/20 0603  INR 1.2 1.2 1.1  1.3* 1.4*   Cardiac Enzymes: No results for input(s): CKTOTAL, CKMB, CKMBINDEX, TROPONINI in the last 168 hours. BNP (last 3 results) No results for input(s): PROBNP in the last 8760 hours. HbA1C: No results for input(s): HGBA1C in the last 72 hours. CBG: No results for input(s): GLUCAP in the last 168 hours. Lipid Profile: No results for input(s): CHOL, HDL, LDLCALC, TRIG, CHOLHDL, LDLDIRECT in the last 72 hours. Thyroid Function Tests: No results for input(s): TSH, T4TOTAL, FREET4, T3FREE, THYROIDAB in the last 72 hours. Anemia Panel: Recent Labs    12/06/20 0514  VITAMINB12 139*   Sepsis Labs: No results for input(s): PROCALCITON, LATICACIDVEN in the last 168 hours.  Recent Results (from the past 240 hour(s))  Resp Panel by RT-PCR (Flu A&B, Covid) Nasopharyngeal Swab     Status: None   Collection Time: 12/02/20  8:13 AM   Specimen: Nasopharyngeal Swab; Nasopharyngeal(NP) swabs in vial transport medium  Result Value Ref Range Status   SARS Coronavirus 2 by RT PCR NEGATIVE NEGATIVE Final    Comment: (NOTE) SARS-CoV-2 target nucleic acids are NOT DETECTED.  The SARS-CoV-2 RNA is generally detectable in upper respiratory specimens during the acute phase of infection. The lowest concentration of SARS-CoV-2 viral copies this assay can detect is 138 copies/mL. A negative result does not preclude SARS-Cov-2 infection and should not be used as the sole basis for treatment or other patient management decisions. A negative result may occur with  improper specimen collection/handling, submission of specimen other than nasopharyngeal swab, presence of viral mutation(s) within the areas targeted by this assay, and inadequate number of viral copies(<138 copies/mL). A negative result must be combined with clinical observations, patient history, and epidemiological information. The expected result is Negative.  Fact Sheet for Patients:   EntrepreneurPulse.com.au  Fact Sheet for Healthcare Providers:  IncredibleEmployment.be  This test is no t yet approved or cleared by the [place] FDA and  has been authorized for detection and/or diagnosis of SARS-CoV-2 by FDA under an Emergency Use Authorization (EUA). This EUA will remain  in effect (meaning this test can be used) for the duration of the COVID-19 declaration under Section 564(b)(1) of the Act, 21 U.S.C.section 360bbb-3(b)(1), unless the authorization is terminated  or revoked sooner.       Influenza A by PCR NEGATIVE NEGATIVE Final   Influenza B by PCR NEGATIVE NEGATIVE Final    Comment: (NOTE) The Xpert Xpress SARS-CoV-2/FLU/RSV plus assay is intended as an aid in the diagnosis of influenza from Nasopharyngeal swab specimens and should not be used as a sole basis for treatment. Nasal washings and aspirates are unacceptable for Xpert Xpress SARS-CoV-2/FLU/RSV  testing.  Fact Sheet for Patients: EntrepreneurPulse.com.au  Fact Sheet for Healthcare Providers: IncredibleEmployment.be  This test is not yet approved or cleared by the [place] FDA and has been authorized for detection and/or diagnosis of SARS-CoV-2 by FDA under an Emergency Use Authorization (EUA). This EUA will remain in effect (meaning this test can be used) for the duration of the COVID-19 declaration under Section 564(b)(1) of the Act, 21 U.S.C. section 360bbb-3(b)(1), unless the authorization is terminated or revoked.  Performed at [facility], [address], [place], [place] [address]          Radiology Studies: No results found.      Scheduled Meds: . bisacodyl  10 mg Rectal Once  . docusate sodium  100 mg Oral BID  . feeding supplement  237 mL Oral TID BM  . ferrous sulfate  325 mg Oral QODAY  . folic acid  1 mg Oral Daily  . gabapentin  300 mg Oral TID  . losartan  100 mg Oral  Daily  . magnesium oxide  800 mg Oral Daily  . metoprolol succinate  100 mg Oral QHS  . multivitamin with minerals  1 tablet Oral Daily  . pantoprazole  40 mg Oral BID  . polyethylene glycol  17 g Oral Daily  . senna  1 tablet Oral BID  . thiamine  100 mg Oral Daily  . traMADol  50 mg Oral Q6H  . vitamin B-12  1,000 mcg Oral Daily  . Vitamin D3  1,000 Units Oral Daily  . Warfarin - Pharmacist Dosing Inpatient   Does not apply q1600   Continuous Infusions: . sodium chloride 10 mL/hr at 12/04/20 0236  . heparin 1,600 Units/hr (12/08/20 0557)  . methocarbamol (ROBAXIN) IV       LOS: 6 days    Time spent: 32 mins     Wyvonnia Dusky, MD [facility] Pager [phone]  If 7PM-7AM, please contact night-coverage 12/08/2020, 8:25 AM

## 2020-12-08 NOTE — TOC Progression Note (Signed)
Transition of Care [facility]) - Progression Note    Patient Details  Name: Kenneth Mccall MRN: [id number] Date of Birth: [birth year]  Transition of Care [facility]) CM/SW Contact  Shelbie Ammons, RN Phone Number: 12/08/2020, 1:45 PM  Clinical Narrative:   RNCM met with patient in room to discuss bed offers, patient chooses bed at [facility]. Patient reports that his sister has gotten in touch with DSS and understands that his Medicaid application is under way. Patient reports that he has had his 2 Covid vaccines but has not had the booster. RNCM accepted bed in hub and notified facility. Patient will likely not be ready until tomorrow.     Expected Discharge Plan: [place] Barriers to Discharge: No Barriers Identified  Expected Discharge Plan and Services Expected Discharge Plan: [place] arrangements for the past 2 months: [place]                                       Social Determinants of Health (SDOH) Interventions    Readmission Risk Interventions Readmission Risk Prevention Plan 12/06/2020  Transportation Screening Complete  PCP or Specialist Appt within 3-5 Days Complete  HRI or [place] Complete  Social Work Consult for [place] Planning/Counseling Complete  Palliative Care Screening Not Applicable  Medication Review Press photographer) Complete  Some recent data might be hidden

## 2020-12-09 LAB — CBC
HCT: 26.9 % — ABNORMAL LOW (ref 39.0–52.0)
Hemoglobin: 8.6 g/dL — ABNORMAL LOW (ref 13.0–17.0)
MCH: 26.3 pg (ref 26.0–34.0)
MCHC: 32 g/dL (ref 30.0–36.0)
MCV: 82.3 fL (ref 80.0–100.0)
Platelets: 212 10*3/uL (ref 150–400)
RBC: 3.27 MIL/uL — ABNORMAL LOW (ref 4.22–5.81)
RDW: 15.5 % (ref 11.5–15.5)
WBC: 9.9 10*3/uL (ref 4.0–10.5)
nRBC: 0 % (ref 0.0–0.2)

## 2020-12-09 LAB — MAGNESIUM: Magnesium: 1.6 mg/dL — ABNORMAL LOW (ref 1.7–2.4)

## 2020-12-09 LAB — BASIC METABOLIC PANEL
Anion gap: 6 (ref 5–15)
BUN: 12 mg/dL (ref 8–23)
CO2: 28 mmol/L (ref 22–32)
Calcium: 8.8 mg/dL — ABNORMAL LOW (ref 8.9–10.3)
Chloride: 104 mmol/L (ref 98–111)
Creatinine, Ser: 0.47 mg/dL — ABNORMAL LOW (ref 0.61–1.24)
GFR, Estimated: >60 mL/min (ref >60)
Glucose, Bld: 107 mg/dL — ABNORMAL HIGH (ref 70–99)
Potassium: 3.4 mmol/L — ABNORMAL LOW (ref 3.5–5.1)
Sodium: 138 mmol/L (ref 135–145)

## 2020-12-09 LAB — PROTIME-INR
INR: 1.5 — ABNORMAL HIGH (ref 0.8–1.2)
Prothrombin Time: 17.2 seconds — ABNORMAL HIGH (ref 11.4–15.2)

## 2020-12-09 LAB — HEPARIN LEVEL (UNFRACTIONATED): Heparin Unfractionated: 0.49 IU/mL (ref 0.30–0.70)

## 2020-12-09 MED ORDER — WARFARIN SODIUM 7.5 MG PO TABS: 7.5000 mg | ORAL_TABLET | Freq: Once | ORAL | Status: DC

## 2020-12-09 MED ORDER — MAGNESIUM SULFATE 4 GM/100ML IV SOLN
4.0000 g | Freq: Once | INTRAVENOUS | Status: AC
  Administered 2020-12-09: 4 g via INTRAVENOUS
  Filled 2020-12-09: qty 100

## 2020-12-09 MED ORDER — WARFARIN SODIUM 10 MG PO TABS
10.0000 mg | ORAL_TABLET | Freq: Once | ORAL | Status: AC
  Administered 2020-12-09: 10 mg via ORAL
  Filled 2020-12-09: qty 1

## 2020-12-09 MED ORDER — POTASSIUM CHLORIDE CRYS ER 20 MEQ PO TBCR
40.0000 meq | EXTENDED_RELEASE_TABLET | Freq: Once | ORAL | Status: AC
  Administered 2020-12-09: 40 meq via ORAL
  Filled 2020-12-09: qty 2

## 2020-12-09 NOTE — Progress Notes (Signed)
Physical Therapy Treatment Patient Details Name: Kenneth Mccall MRN: [id number] DOB: 1956/04/28 Today's Date: 12/09/2020    History of Present Illness Pt admitted to [facility] on 12/02/20 for eval of R hip pain following a mechanical fall. Pt with recent L hip IM nailing on 10/01/20. 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 Pt recently DC from hospital on 11/18/20 for nondisplaced L femoral shaft fx (addressed with conservative management) for rehab to continue PT, but was DC home because he does not have a pay source. Pt s/p R IM nailing on 12/03/20 by Dr. Mack Guise.    PT Comments    Patient motivated to participate with PT. Patient continues to need maximal assistance for bed mobility. Poor sitting balance with left lateral lean to off load due to right hip pain. Patient was unable to progress to standing due to right hip pain and required increased assistance for return to bed. Recommend to continue PT, planning sessions around pain medication schedule. SNF is recommended at discharge.     Follow Up Recommendations  SNF     Equipment Recommendations   (to be determined at next level of care)    Recommendations for Other Services       Precautions / Restrictions Precautions Precautions: Fall Restrictions Weight Bearing Restrictions: Yes RLE Weight Bearing: Weight bearing as tolerated    Mobility  Bed Mobility Overal bed mobility: Needs Assistance Bed Mobility: Supine to Sit;Sit to Supine     Supine to sit: Max assist Sit to supine: Max assist;+2 for physical assistance   General bed mobility comments: verbal cues for sequencing and technique. assistance for LE and trunk support. increased assistance  requried to return to bed due to fatigue. increased time required to complete tasks    Transfers                 General transfer comment: unable to progress to sit to stand transfers due to poor sitting balance, pain. patient required Max A +2 person for scooting hips posteriorly in bed  Ambulation/Gait                 Stairs             Wheelchair Mobility    Modified Rankin (Stroke Patients Only)       Balance Overall balance assessment: Needs assistance Sitting-balance support: Feet supported;Single extremity supported Sitting balance-Leahy Scale: Poor Sitting balance - Comments: Min A required due to left lateral lean secondary to pain despite cues for upright and midline posture. Postural control: Left lateral lean                                  Cognition Arousal/Alertness: Awake/alert Behavior During Therapy: WFL for tasks assessed/performed Overall Cognitive Status: History of cognitive impairments - at baseline                                 General Comments: intermittent cues for redirection      Exercises Total Joint Exercises Ankle Circles/Pumps: AROM;Strengthening;Right;10 reps;Supine Gluteal Sets: AROM;Strengthening;Both;10 reps;Supine Other Exercises Other Exercises: short arc quad performed in sitting position x 10 reps  with AAROM in supine position. verbal and visual cues for exercise technique    General Comments        Pertinent Vitals/Pain Pain Assessment: 0-10 Pain Score: 7  Pain Location: right hip Pain Descriptors / Indicators: Sore;Aching Pain Intervention(s): Premedicated before session;Limited activity within patient's tolerance;Monitored during session    Home Living                      Prior Function            PT Goals (current goals can now be found in the care plan section) Acute Rehab PT Goals Patient Stated Goal: to get better PT Goal Formulation: With  patient Time For Goal Achievement: 12/18/20 Potential to Achieve Goals: Fair Progress towards PT goals: Progressing toward goals    Frequency    7X/week      PT Plan Current plan remains appropriate    Co-evaluation              AM-PAC PT "6 Clicks" Mobility   Outcome Measure  Help needed turning from your back to your side while in a flat bed without using bedrails?: A Lot Help needed moving from lying on your back to sitting on the side of a flat bed without using bedrails?: A Lot Help needed moving to and from a bed to a chair (including a wheelchair)?: A Lot Help needed standing up from a chair using your arms (e.g., wheelchair or bedside chair)?: Total Help needed to walk in hospital room?: Total Help needed climbing 3-5 steps with a railing? : Total 6 Click Score: 9    End of Session   Activity Tolerance: Patient limited by pain Patient left: in bed;with call bell/phone within reach;with bed alarm set Nurse Communication: Mobility status PT Visit Diagnosis: Unsteadiness on feet (R26.81);History of falling (Z91.81);Repeated falls (R29.6);Difficulty in walking, not elsewhere classified (R26.2);Muscle weakness (generalized) (M62.81);Pain Pain - Right/Left: Right Pain - part of body: Hip     Time: 3716-9678 PT Time Calculation (min) (ACUTE ONLY): 44 min  Charges:  $Therapeutic Exercise: 8-22 mins $Therapeutic Activity: 23-37 mins                    [name], PT, MPT    [name] 12/09/2020, 4:03 PM

## 2020-12-09 NOTE — Plan of Care (Signed)

## 2020-12-09 NOTE — Progress Notes (Signed)
PT Cancellation Note  Patient Details Name: Kenneth Mccall MRN: [id number] DOB: [birth year]   Cancelled Treatment:     PT attempt. Currently with OT. Will return this afternoon for PT session.    Willette Pa 12/09/2020, 10:35 AM

## 2020-12-09 NOTE — TOC Progression Note (Signed)
Transition of Care [facility]) - Progression Note    Patient Details  Name: Kenneth Mccall MRN: [id number] Date of Birth: 12-19-1955  Transition of Care [facility]) CM/SW [place], RN Phone Number: 12/09/2020, 9:20 AM  Clinical Narrative:   RNCM reached out to Navi representative to check on insurance authorization. Per representative authorization is still under review.     Expected Discharge Plan: [place] Barriers to Discharge: No Barriers Identified  Expected Discharge Plan and Services Expected Discharge Plan: [place] arrangements for the past 2 months: [place]                                       Social Determinants of Health (SDOH) Interventions    Readmission Risk Interventions Readmission Risk Prevention Plan 12/06/2020  Transportation Screening Complete  PCP or Specialist Appt within 3-5 Days Complete  HRI or [place] Complete  Social Work Consult for [place] Planning/Counseling Complete  Palliative Care Screening Not Applicable  Medication Review Press photographer) Complete  Some recent data might be hidden
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
## 2020-12-09 NOTE — Progress Notes (Signed)
Occupational Therapy Treatment Patient Details Name: Kenneth Mccall MRN: [id number] DOB: [birth year] Today's Date: 12/09/2020    History of present illness Pt admitted to [facility] on 12/02/20 for eval of R hip pain following a mechanical fall. Pt with recent L hip IM nailing on 10/01/20. 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 Pt recently DC from hospital on 11/18/20 for nondisplaced L femoral shaft fx (addressed with conservative management) for rehab to continue PT, but was DC home because he does not have a pay source. Pt s/p R IM nailing on 12/03/20 by Dr. Mack Guise.   OT comments  Upon entering the room, pt supine in bed with NT present providing assistance. OT offered to assist pt with transfer to [facility] but pt declined. NT reports she was getting ready to assist pt with bath. OT requesting pt to come to EOB to attempt some self care tasks from EOB but pt declined that as well. Pt reports 8/10 pain in R hip with RN reporting pt has been medication prior to session. Pt required max encouragement this session as he reports, " I just can't do any of this". Pt demonstrated ability to wash face and brush teeth with set up A to obtain all needed items. Pt washing UB with assistance to wash L UE. Pt positioned for him to be successful with washing peri area and B thighs. Pt needing assistance to wash B LE/feet. Pt rolling to the L with mod A and therapist provided assistance to wash buttocks. Repositioning in the bed with pt using trapeze bar to assist. RN present in the room giving medication as therapist exited. Pt continues to benefit from OT intervention with pain being a barrier during this session.    Follow Up Recommendations   SNF    Equipment Recommendations  Other (comment) (defer to next venue of care)       Precautions / Restrictions Precautions Precautions: Fall Restrictions Weight Bearing Restrictions: No RLE Weight Bearing: Weight bearing as tolerated       Mobility Bed Mobility Overal bed mobility: Needs Assistance Bed Mobility: Rolling Rolling: Mod assist                       ADL either performed or assessed with clinical judgement   ADL Overall ADL's : Needs assistance/impaired     Grooming: Wash/dry face;Oral care;Set up;Bed level   Upper Body Bathing: Minimal assistance;Bed level Upper Body Bathing Details (indicate cue type and reason): assistance to wash L UE Lower Body Bathing: Maximal assistance;Bed level   Upper Body Dressing : Moderate assistance Upper Body Dressing Details (indicate cue type and reason): hospital gown                   General ADL Comments: Pt declined attempt for functional transfer to [facility] as well as sitting EOB for self care tasks. Maximal encouragement for OT participation.     Vision Patient Visual Report: No change from baseline            Cognition Arousal/Alertness: Awake/alert Behavior During Therapy: WFL for tasks assessed/performed Overall Cognitive Status: History of cognitive impairments - at baseline  General Comments: Pleasant throughout with min cuing for redirection for tasks                   Pertinent Vitals/ Pain       Pain Assessment: 0-10 Pain Score: 8  Pain Location: R hip Pain Descriptors / Indicators: Sore;Aching Pain Intervention(s): Limited activity within patient's tolerance;Monitored during session;Premedicated before session;Patient requesting pain meds-RN notified;Repositioned  Home Living Family/patient expects to be discharged to:: Private residence Living Arrangements: Alone Available Help at Discharge: Personal care attendant;Available  PRN/intermittently;Family                                        Frequency  Min 1X/week        Progress Toward Goals  OT Goals(current goals can now be found in the care plan section)  Progress towards OT goals: Progressing toward goals  Acute Rehab OT Goals Patient Stated Goal: to get better OT Goal Formulation: With patient Time For Goal Achievement: 12/18/20 Potential to Achieve Goals: Good  Plan Discharge plan remains appropriate;Frequency remains appropriate       AM-PAC OT "6 Clicks" Daily Activity     Outcome Measure   Help from another person eating meals?: A Little Help from another person taking care of personal grooming?: A Little Help from another person toileting, which includes using toliet, bedpan, or urinal?: Total Help from another person bathing (including washing, rinsing, drying)?: A Lot Help from another person to put on and taking off regular upper body clothing?: A Little Help from another person to put on and taking off regular lower body clothing?: Total 6 Click Score: 13    End of Session    OT Visit Diagnosis: Other abnormalities of gait and mobility (R26.89);Muscle weakness (generalized) (M62.81)   Activity Tolerance Patient tolerated treatment well   Patient Left in bed;with call bell/phone within reach;with bed alarm set   Nurse Communication Mobility status;Patient requests pain meds        Time: 1245-8099 OT Time Calculation (min): 39 min  Charges: OT General Charges $OT Visit: 1 Visit OT Treatments $Self Care/Home Management : 38-52 mins  Darleen Crocker, MS, OTR/L , CBIS ascom [phone]  12/09/20, 1:06 PM

## 2020-12-09 NOTE — Care Management Important Message (Signed)
Important Message  Patient Details  Name: Kenneth Mccall MRN: [id number] Date of Birth: [birth year]   Medicare Important Message Given:  Yes     Juliann Pulse A Allmond 12/09/2020, 11:42 AM

## 2020-12-09 NOTE — Progress Notes (Signed)
Subjective:  POD #6 s/p intramedullary fixation for right intertrochanteric hip fracture.   Patient reports right hip pain as mild.  Patient states his pain is improved today.  He states he got up twice with physical therapy today.  Objective:   VITALS:   Vitals:   12/09/20 0527 12/09/20 0818 12/09/20 1122 12/09/20 1522  BP: (!) 165/75 (!) 151/93 135/83 (!) 156/94  Pulse: 71 68 81 78  Resp: 18 19 18 19   Temp: 98.2 F (36.8 C) 98.5 F (36.9 C) 98.2 F (36.8 C) 98.9 F (37.2 C)  TempSrc:  Oral    SpO2: 100% 100% 98% 100%  Weight:      Height:        PHYSICAL EXAM: Right lower extremity Neurovascular intact Sensation intact distally Intact pulses distally Dorsiflexion/Plantar flexion intact Incision: dressing C/D/I No cellulitis present Compartment soft  LABS  Results for orders placed or performed during the hospital encounter of 12/02/20 (from the past 24 hour(s))  Heparin level (unfractionated)     Status: None   Collection Time: 12/08/20 10:48 PM  Result Value Ref Range   Heparin Unfractionated 0.43 0.30 - 0.70 IU/mL  Protime-INR     Status: Abnormal   Collection Time: 12/09/20  6:49 AM  Result Value Ref Range   Prothrombin Time 17.2 (H) 11.4 - 15.2 seconds   INR 1.5 (H) 0.8 - 1.2  CBC     Status: Abnormal   Collection Time: 12/09/20  6:49 AM  Result Value Ref Range   WBC 9.9 4.0 - 10.5 K/uL   RBC 3.27 (L) 4.22 - 5.81 MIL/uL   Hemoglobin 8.6 (L) 13.0 - 17.0 g/dL   HCT 26.9 (L) 39.0 - 52.0 %   MCV 82.3 80.0 - 100.0 fL   MCH 26.3 26.0 - 34.0 pg   MCHC 32.0 30.0 - 36.0 g/dL   RDW 15.5 11.5 - 15.5 %   Platelets 212 150 - 400 K/uL   nRBC 0.0 0.0 - 0.2 %  Basic metabolic panel     Status: Abnormal   Collection Time: 12/09/20  6:49 AM  Result Value Ref Range   Sodium 138 135 - 145 mmol/L   Potassium 3.4 (L) 3.5 - 5.1 mmol/L   Chloride 104 98 - 111 mmol/L   CO2 28 22 - 32 mmol/L   Glucose, Bld 107 (H) 70 - 99 mg/dL   BUN 12 8 - 23 mg/dL   Creatinine, Ser  0.47 (L) 0.61 - 1.24 mg/dL   Calcium 8.8 (L) 8.9 - 10.3 mg/dL   GFR, Estimated >60 >60 mL/min   Anion gap 6 5 - 15  Magnesium     Status: Abnormal   Collection Time: 12/09/20  6:49 AM  Result Value Ref Range   Magnesium 1.6 (L) 1.7 - 2.4 mg/dL  Heparin level (unfractionated)     Status: None   Collection Time: 12/09/20  6:49 AM  Result Value Ref Range   Heparin Unfractionated 0.49 0.30 - 0.70 IU/mL    No results found.  Assessment/Plan: 6 Days Post-Op   Principal Problem:   Closed right hip fracture (HCC) Active Problems:   Hx of aortic valve replacement   Essential hypertension   Protein-calorie malnutrition, severe   Fall   Chronic back pain   Patient doing well from orthopedic standpoint.  Continue with physical therapy.  Continue Coumadin which will cover him for DVT prophylaxis.  The patient will need to follow-up in my office in approximately 10 days after discharge  from the hospital.  Patient will require skilled nursing facility upon discharge.   [place] , MD 12/09/2020, 4:02 PM

## 2020-12-09 NOTE — Progress Notes (Signed)
PROGRESS NOTE    Kenneth Mccall  FBP:[id number] DOB: 20-Jul-1956 DOA: 12/02/2020 PCP: System, Provider Not In    Assessment & Plan:   Principal Problem:   Closed right hip fracture ([place]) Active Problems:   Hx of aortic valve replacement   Essential hypertension   Protein-calorie malnutrition, severe   Fall   Chronic back pain   Acute intertrochanteric fracture of right hip: s/p mechanical fall. S/p intramedullary fixation of right intertrochanteric hip fracture on 12/03/2020. PT recs SNF.   Acute postoperative blood loss anemia: w/ IDA. S/p 2 units of pRBCs transfused. Continue on iron supplements   Hypokalemia: potassium repleted. Will continue to monitor   Hypomagnesemia: Mg sulfate ordered. Give Mg prior to giving potassium. Will continue to monitor   Hx of aortic valve repair: in 2012. Continue on warfarin & IV heparin for bridging. INR is still subtherapeutic today. Monitoring and dosing of coumadin as per pharmacy   HTN: continue on metoprolol, losartan   Constipation: continue on senna   Atrial flutter: unknown typical vs atypical. Continue on coumadin, metoprolol    DVT prophylaxis: coumadin  Code Status: full  Family Communication:  Disposition Plan: SNF  Level of care: Med-Surg   Status is: Inpatient  Remains inpatient appropriate because:Unsafe d/c plan, IV treatments appropriate due to intensity of illness or inability to take PO and Inpatient level of care appropriate due to severity of illness   Dispo: The patient is from: Home              Anticipated d/c is to: SNF              Patient currently is not medically stable to d/c.   Difficult to place patient Yes    Consultants:   Ortho surg    Procedures:    Antimicrobials:    Subjective: Pt c/o right hip pain   Objective: Vitals:   12/08/20 1701 12/08/20 2052 12/09/20 0205 12/09/20 0527  BP: (!) 178/69 (!) 168/75 (!) 154/80 (!) 165/75  Pulse: 86 95 85 71  Resp: 16 18 18 18   Temp:  98.1 F (36.7 C) 98.9 F (37.2 C) 98.1 F (36.7 C) 98.2 F (36.8 C)  TempSrc:      SpO2: 100% 98% 97% 100%  Weight:      Height:        Intake/Output Summary (Last 24 hours) at 12/09/2020 0746 Last data filed at 12/08/2020 2000 Gross per 24 hour  Intake 0 ml  Output 250 ml  Net -250 ml   Filed Weights   12/02/20 0549 12/08/20 1541  Weight: 77.6 kg 71.1 kg    Examination:  General exam: Appears uncomfortable  Respiratory system: clear breath sounds b/l  Cardiovascular system: S1/S2+. No rubs or gallops  Gastrointestinal system: Abd is soft, NT, ND & hypoactive bowel sounds  Central nervous system:  Alert and oriented  Psychiatry: Judgement and insight appear normal. Flat mood and affect     Data Reviewed: I have personally reviewed following labs and imaging studies  CBC: Recent Labs  Lab 12/04/20 0454 12/05/20 0550 12/06/20 0514 12/07/20 0614 12/08/20 0603  WBC 16.3* 12.6* 11.2* 9.5 9.8  NEUTROABS 13.6* 9.4*  --   --   --   HGB 8.8* 8.1* 7.6* 7.7* 9.0*  HCT 28.2* 25.2* 23.4* 23.9* 28.3*  MCV 81.0 79.5* 79.9* 80.5 84.2  PLT 150 154 170 176 824   Basic Metabolic Panel: Recent Labs  Lab 12/04/20 0454 12/05/20 0550 12/06/20 0514 12/07/20 2353  12/08/20 0603  NA 136 136 137 136 137  K 3.6 3.4* 3.4* 3.7 3.4*  CL 101 101 101 102 103  CO2 29 27 29 27 26   GLUCOSE 133* 136* 126* 116* 106*  BUN 19 25* 26* 20 17  CREATININE 0.70 0.82 0.77 0.58* 0.59*  CALCIUM 8.4* 8.6* 8.9 8.9 8.8*  MG  --   --   --  1.4* 1.6*  PHOS  --   --   --  3.1  --    GFR: Estimated Creatinine Clearance: 91.3 mL/min (A) (by C-G formula based on SCr of 0.59 mg/dL (L)). Liver Function Tests: No results for input(s): AST, ALT, ALKPHOS, BILITOT, PROT, ALBUMIN in the last 168 hours. No results for input(s): LIPASE, AMYLASE in the last 168 hours. No results for input(s): AMMONIA in the last 168 hours. Coagulation Profile: Recent Labs  Lab 12/04/20 0454 12/05/20 0550 12/06/20 0514  12/07/20 0614 12/08/20 0603  INR 1.2 1.2 1.1 1.3* 1.4*   Cardiac Enzymes: No results for input(s): CKTOTAL, CKMB, CKMBINDEX, TROPONINI in the last 168 hours. BNP (last 3 results) No results for input(s): PROBNP in the last 8760 hours. HbA1C: No results for input(s): HGBA1C in the last 72 hours. CBG: No results for input(s): GLUCAP in the last 168 hours. Lipid Profile: No results for input(s): CHOL, HDL, LDLCALC, TRIG, CHOLHDL, LDLDIRECT in the last 72 hours. Thyroid Function Tests: No results for input(s): TSH, T4TOTAL, FREET4, T3FREE, THYROIDAB in the last 72 hours. Anemia Panel: No results for input(s): VITAMINB12, FOLATE, FERRITIN, TIBC, IRON, RETICCTPCT in the last 72 hours. Sepsis Labs: No results for input(s): PROCALCITON, LATICACIDVEN in the last 168 hours.  Recent Results (from the past 240 hour(s))  Resp Panel by RT-PCR (Flu A&B, Covid) Nasopharyngeal Swab     Status: None   Collection Time: 12/02/20  8:13 AM   Specimen: Nasopharyngeal Swab; Nasopharyngeal(NP) swabs in vial transport medium  Result Value Ref Range Status   SARS Coronavirus 2 by RT PCR NEGATIVE NEGATIVE Final    Comment: (NOTE) SARS-CoV-2 target nucleic acids are NOT DETECTED.  The SARS-CoV-2 RNA is generally detectable in upper respiratory specimens during the acute phase of infection. The lowest concentration of SARS-CoV-2 viral copies this assay can detect is 138 copies/mL. A negative result does not preclude SARS-Cov-2 infection and should not be used as the sole basis for treatment or other patient management decisions. A negative result may occur with  improper specimen collection/handling, submission of specimen other than nasopharyngeal swab, presence of viral mutation(s) within the areas targeted by this assay, and inadequate number of viral copies(<138 copies/mL). A negative result must be combined with clinical observations, patient history, and epidemiological information. The expected  result is Negative.  Fact Sheet for Patients:  EntrepreneurPulse.com.au  Fact Sheet for Healthcare Providers:  IncredibleEmployment.be  This test is no t yet approved or cleared by the [place] FDA and  has been authorized for detection and/or diagnosis of SARS-CoV-2 by FDA under an Emergency Use Authorization (EUA). This EUA will remain  in effect (meaning this test can be used) for the duration of the COVID-19 declaration under Section 564(b)(1) of the Act, 21 U.S.C.section 360bbb-3(b)(1), unless the authorization is terminated  or revoked sooner.       Influenza A by PCR NEGATIVE NEGATIVE Final   Influenza B by PCR NEGATIVE NEGATIVE Final    Comment: (NOTE) The Xpert Xpress SARS-CoV-2/FLU/RSV plus assay is intended as an aid in the diagnosis of influenza from Nasopharyngeal swab  specimens and should not be used as a sole basis for treatment. Nasal washings and aspirates are unacceptable for Xpert Xpress SARS-CoV-2/FLU/RSV testing.  Fact Sheet for Patients: EntrepreneurPulse.com.au  Fact Sheet for Healthcare Providers: IncredibleEmployment.be  This test is not yet approved or cleared by the [place] FDA and has been authorized for detection and/or diagnosis of SARS-CoV-2 by FDA under an Emergency Use Authorization (EUA). This EUA will remain in effect (meaning this test can be used) for the duration of the COVID-19 declaration under Section 564(b)(1) of the Act, 21 U.S.C. section 360bbb-3(b)(1), unless the authorization is terminated or revoked.  Performed at [facility], [address], [place], [place] [address]          Radiology Studies: No results found.      Scheduled Meds: . bisacodyl  10 mg Rectal Once  . docusate sodium  100 mg Oral BID  . feeding supplement  237 mL Oral TID BM  . ferrous sulfate  325 mg Oral QODAY  . folic acid  1 mg Oral Daily  .  gabapentin  300 mg Oral TID  . losartan  100 mg Oral Daily  . magnesium oxide  800 mg Oral Daily  . metoprolol succinate  100 mg Oral QHS  . multivitamin with minerals  1 tablet Oral Daily  . pantoprazole  40 mg Oral BID  . polyethylene glycol  17 g Oral Daily  . senna  1 tablet Oral BID  . thiamine  100 mg Oral Daily  . traMADol  50 mg Oral Q6H  . vitamin B-12  1,000 mcg Oral Daily  . Vitamin D3  1,000 Units Oral Daily  . Warfarin - Pharmacist Dosing Inpatient   Does not apply q1600   Continuous Infusions: . sodium chloride 250 mL (12/08/20 1738)  . heparin 1,750 Units/hr (12/08/20 2124)  . methocarbamol (ROBAXIN) IV       LOS: 7 days    Time spent: 34 mins     Wyvonnia Dusky, MD [facility] Pager [phone]  If 7PM-7AM, please contact night-coverage 12/09/2020, 7:46 AM

## 2020-12-10 LAB — BASIC METABOLIC PANEL
Anion gap: 5 (ref 5–15)
BUN: 10 mg/dL (ref 8–23)
CO2: 30 mmol/L (ref 22–32)
Calcium: 9 mg/dL (ref 8.9–10.3)
Chloride: 101 mmol/L (ref 98–111)
Creatinine, Ser: 0.49 mg/dL — ABNORMAL LOW (ref 0.61–1.24)
GFR, Estimated: >60 mL/min (ref >60)
Glucose, Bld: 125 mg/dL — ABNORMAL HIGH (ref 70–99)
Potassium: 4.1 mmol/L (ref 3.5–5.1)
Sodium: 136 mmol/L (ref 135–145)

## 2020-12-10 LAB — CBC
HCT: 27.2 % — ABNORMAL LOW (ref 39.0–52.0)
Hemoglobin: 8.8 g/dL — ABNORMAL LOW (ref 13.0–17.0)
MCH: 26.6 pg (ref 26.0–34.0)
MCHC: 32.4 g/dL (ref 30.0–36.0)
MCV: 82.2 fL (ref 80.0–100.0)
Platelets: 260 10*3/uL (ref 150–400)
RBC: 3.31 MIL/uL — ABNORMAL LOW (ref 4.22–5.81)
RDW: 15.8 % — ABNORMAL HIGH (ref 11.5–15.5)
WBC: 13.3 10*3/uL — ABNORMAL HIGH (ref 4.0–10.5)
nRBC: 0 % (ref 0.0–0.2)

## 2020-12-10 LAB — RESP PANEL BY RT-PCR (FLU A&B, COVID) ARPGX2
Influenza A by PCR: NEGATIVE
Influenza B by PCR: NEGATIVE
SARS Coronavirus 2 by RT PCR: NEGATIVE

## 2020-12-10 LAB — PROTIME-INR
INR: 1.6 — ABNORMAL HIGH (ref 0.8–1.2)
Prothrombin Time: 18.1 seconds — ABNORMAL HIGH (ref 11.4–15.2)

## 2020-12-10 LAB — HEPARIN LEVEL (UNFRACTIONATED): Heparin Unfractionated: 0.38 IU/mL (ref 0.30–0.70)

## 2020-12-10 LAB — MAGNESIUM: Magnesium: 1.6 mg/dL — ABNORMAL LOW (ref 1.7–2.4)

## 2020-12-10 MED ORDER — COVID-19 MRNA VACC (MODERNA) 50 MCG/0.25ML IM SUSP
0.2500 mL | Freq: Once | INTRAMUSCULAR | Status: AC
  Administered 2020-12-10: 0.25 mL via INTRAMUSCULAR
  Filled 2020-12-10: qty 0.25

## 2020-12-10 MED ORDER — WARFARIN SODIUM 10 MG PO TABS
10.0000 mg | ORAL_TABLET | Freq: Once | ORAL | Status: DC
  Filled 2020-12-10: qty 1

## 2020-12-10 MED ORDER — OXYCODONE HCL 5 MG PO TABS
5.0000 mg | ORAL_TABLET | Freq: Four times a day (QID) | ORAL | 0 refills | Status: AC | PRN
Start: 2020-12-10 — End: 2020-12-12

## 2020-12-10 MED ORDER — FERROUS SULFATE 325 (65 FE) MG PO TABS: 325.0000 mg | ORAL_TABLET | ORAL | 0 refills | Status: AC

## 2020-12-10 MED ORDER — ENOXAPARIN SODIUM 80 MG/0.8ML ~~LOC~~ SOLN: 70.0000 mg | Freq: Two times a day (BID) | SUBCUTANEOUS | 0 refills | Status: AC

## 2020-12-10 NOTE — TOC Transition Note (Signed)
Transition of Care [facility]) - CM/SW Discharge Note   Patient Details  Name: Kenneth Mccall MRN: [id number] Date of Birth: 26-Mar-1956  [facility]) CM/SW Contact:  Candie Chroman, LCSW Phone Number: 12/10/2020, 1:15 PM   Clinical Narrative:  Patient has orders to discharge to [facility] today. RN will call report to [phone] (Room 203A). EMS transport has been arranged. He is 3rd on the list. No further concerns. CSW signing off.   Final next level of care: Skilled Nursing Facility Barriers to Discharge: Barriers Resolved   Patient Goals and CMS Choice     Choice offered to / list presented to : Sibling  Discharge Placement   Existing PASRR number confirmed : 12/06/20          Patient chooses bed at: [facility] Patient to be transferred to facility by: EMS Name of family member notified: Stevphen Meuse Patient and family notified of of transfer: 12/10/20  Discharge Plan and Services                                     Social Determinants of Health (SDOH) Interventions     Readmission Risk Interventions Readmission Risk Prevention Plan 12/06/2020  Transportation Screening Complete  PCP or Specialist Appt within 3-5 Days Complete  HRI or [place] Complete  Social Work Consult for [place] Planning/Counseling Complete  Palliative Care Screening Not Applicable  Medication Review Press photographer) Complete  Some recent data might be hidden
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
## 2020-12-10 NOTE — Plan of Care (Signed)
  Problem: Activity: Goal: Risk for activity intolerance will decrease Outcome: Progressing   Problem: Coping: Goal: Level of anxiety will decrease Outcome: Progressing   Problem: Pain Managment: Goal: General experience of comfort will improve Outcome: Progressing   Problem: Safety: Goal: Ability to remain free from injury will improve Outcome: Progressing   

## 2020-12-10 NOTE — TOC Progression Note (Signed)
Transition of Care [facility]) - Progression Note    Patient Details  Name: Kenneth Mccall MRN: [id number] Date of Birth: Mar 12, 1956  Transition of Care [facility]) CM/SW [place], LCSW Phone Number: 12/10/2020, 8:24 AM  Clinical Narrative:  Insurance authorization approved: [id number]. Valid 3/10-3/14. MD and SNF admissions coordinator aware. MD will order rapid COVID test.   Expected Discharge Plan: [place] Barriers to Discharge: No Barriers Identified  Expected Discharge Plan and Services Expected Discharge Plan: [place] arrangements for the past 2 months: [place]                                       Social Determinants of Health (SDOH) Interventions    Readmission Risk Interventions Readmission Risk Prevention Plan 12/06/2020  Transportation Screening Complete  PCP or Specialist Appt within 3-5 Days Complete  HRI or [place] Complete  Social Work Consult for [place] Planning/Counseling Complete  Palliative Care Screening Not Applicable  Medication Review Press photographer) Complete  Some recent data might be hidden

## 2020-12-10 NOTE — Progress Notes (Signed)
Subjective:  POD #7 s/p intramedullary fixation for right intertrochanteric hip fracture.   Patient reports right lower extremity pain as mild to moderate.  Patient is more lethargic today.  His sister is in a chair at the bedside.  Patient complains of low back and right lower extremity pain.  Objective:   VITALS:   Vitals:   12/10/20 0020 12/10/20 0416 12/10/20 0808 12/10/20 1113  BP: (!) 161/90 (!) 153/81 (!) 184/92 (!) 150/90  Pulse: 80 78 80 91  Resp: 18 18 16 18   Temp: 98.7 F (37.1 C) 98 F (36.7 C) 98.7 F (37.1 C) 97.8 F (36.6 C)  TempSrc: Oral Oral    SpO2: 99% 98% 99% 97%  Weight:      Height:        PHYSICAL EXAM: Right lower extremity Neurovascular intact Sensation intact distally Intact pulses distally Dorsiflexion/Plantar flexion intact Incision: scant drainage No cellulitis present Compartment soft  LABS  Results for orders placed or performed during the hospital encounter of 12/02/20 (from the past 24 hour(s))  Basic metabolic panel     Status: Abnormal   Collection Time: 12/10/20  6:52 AM  Result Value Ref Range   Sodium 136 135 - 145 mmol/L   Potassium 4.1 3.5 - 5.1 mmol/L   Chloride 101 98 - 111 mmol/L   CO2 30 22 - 32 mmol/L   Glucose, Bld 125 (H) 70 - 99 mg/dL   BUN 10 8 - 23 mg/dL   Creatinine, Ser 0.49 (L) 0.61 - 1.24 mg/dL   Calcium 9.0 8.9 - 10.3 mg/dL   GFR, Estimated >60 >60 mL/min   Anion gap 5 5 - 15  CBC     Status: Abnormal   Collection Time: 12/10/20  6:52 AM  Result Value Ref Range   WBC 13.3 (H) 4.0 - 10.5 K/uL   RBC 3.31 (L) 4.22 - 5.81 MIL/uL   Hemoglobin 8.8 (L) 13.0 - 17.0 g/dL   HCT 27.2 (L) 39.0 - 52.0 %   MCV 82.2 80.0 - 100.0 fL   MCH 26.6 26.0 - 34.0 pg   MCHC 32.4 30.0 - 36.0 g/dL   RDW 15.8 (H) 11.5 - 15.5 %   Platelets 260 150 - 400 K/uL   nRBC 0.0 0.0 - 0.2 %  Heparin level (unfractionated)     Status: None   Collection Time: 12/10/20  6:52 AM  Result Value Ref Range   Heparin Unfractionated 0.38 0.30  - 0.70 IU/mL  Magnesium     Status: Abnormal   Collection Time: 12/10/20  6:52 AM  Result Value Ref Range   Magnesium 1.6 (L) 1.7 - 2.4 mg/dL  Protime-INR     Status: Abnormal   Collection Time: 12/10/20  6:52 AM  Result Value Ref Range   Prothrombin Time 18.1 (H) 11.4 - 15.2 seconds   INR 1.6 (H) 0.8 - 1.2  Resp Panel by RT-PCR (Flu A&B, Covid) Nasopharyngeal Swab     Status: None   Collection Time: 12/10/20  8:23 AM   Specimen: Nasopharyngeal Swab; Nasopharyngeal(NP) swabs in vial transport medium  Result Value Ref Range   SARS Coronavirus 2 by RT PCR NEGATIVE NEGATIVE   Influenza A by PCR NEGATIVE NEGATIVE   Influenza B by PCR NEGATIVE NEGATIVE    No results found.  Assessment/Plan: 7 Days Post-Op   Principal Problem:   Closed right hip fracture ([facility]) Active Problems:   Hx of aortic valve replacement   Essential hypertension   Protein-calorie malnutrition, severe  Fall   Chronic back pain  Patient is stable from an orthopedic standpoint.  He is being discharged to rehab today by the hospitalist service.  Patient will continue physical therapy for right lower extremity strengthening and will follow up with me in 10 to 14 days after discharge in my office.  Patient will continue his baseline Coumadin.   [place] , MD 12/10/2020, 1:05 PM

## 2020-12-10 NOTE — Progress Notes (Signed)
Pt d/c to [facility] via [place] [facility] report was called by this nurse to nurse at the [facility]  IV removed from pt R and L forearm without issue. [place] dressings fully intact with scant old drainage marked. All belongings taken.  NAD noted at d/c VSS.

## 2020-12-10 NOTE — Discharge Summary (Signed)
Physician Discharge Summary  [name]:[id number] DOB: 1955-10-26 DOA: 12/02/2020  PCP: System, Provider Not In  Admit date: 12/02/2020 Discharge date: 12/10/2020  Admitted From: home  Disposition: SNF  Recommendations for Outpatient Follow-up:  1. Follow up with PCP in 1-2 weeks 2. F/u w/ ortho surg, Dr. Mack Guise, in 10 days  Home Health: no Equipment/Devices:  Discharge Condition: stable  CODE STATUS: full  Diet recommendation: Heart Healthy   Brief/Interim Summary: HPI was taken from Dr. Francine Graven: Kenneth Mccall is a 65 y.o. male with medical history significant for hypertension,anxiety, history of alcohol abuse, history of aortic dissection status post repair, aortic valve repair in 2012 now with mechanical valve(aortic valve replacement on 03/13/2011)on warfarin, history of Wernicke's encephalopathy, frequent falls, history of subdural hematoma, atrial flutter, meningioma in November 2010, horseshoe kidney, hepatitis C diagnosed in 2018, testicular hypofunction, s/p left hip IM nailing on 10/01/20 who presents to the emergency department for evaluation of right hip pain following a fall. Patient states that he was at the bedside states that he fell 1 day prior to his admission while his daughter was visiting him at home and required assistance to get back in bed.  Overnight he developed severe pain in his right hip, elbow and lower back which is the side on which he fell.  He rates his pain an 8 x 10 in intensity at its worst.  He denied feeling dizzy or lightheaded prior to the fall and states that he slipped and fell while walking to his kitchen. He denies having any headache, no blurred vision, no urinary/fecal incontinence, no nocturia, no dysuria, no hematuria, no abdominal pain, no diarrhea, no constipation, no fever, no chills, no nausea, no vomiting, no shortness of breath, no palpitations, no diaphoresis, no chest pain, no cough. Patient was recently discharged from the  hospital on 11/18/20 to rehab following a fall and at that time he presented for evaluation of left hip pain.  CT scan of the left hip showed a nondisplaced fracture extending from the inferior margin of the inter trochanteric fracture obliquely down into the proximal diaphysis laterally up to about 4.7 cm below the lowest (medial) margin of the intertrochanteric fracture.  He was seen in consultation by orthopedic surgery who stated no surgical intervention was needed and recommended physical therapy as well as continued weightbearing as tolerated on the left lower extremity.  Patient was discharged from the hospital to rehab but had to go home since he did not have a pay source.  Labs show sodium 135, potassium 4.4, chloride 97, bicarb 28, glucose 119, BUN 12, creatinine 0.81, calcium 10.1, white count 13.7, hemoglobin 11.8, hematocrit 38.3, MCV 81.8, RDW 15.4, platelet count 214, PT 18.3, INR 1.6 Chest x-ray reviewed by me shows low lung volumes. Atherosclerotic calcification of the aortic arch. Bony demineralization.   CT scan of the head without contrast shows no acute intracranial findings. Stable accentuated density underlying the left frontotemporal craniotomy site measuring 2-3 mm in thickness, favoring either dural thickening or chronic subdural hematoma. This is not appreciably changed compared to 09/30/2020. Periventricular white matter and corona radiata hypodensities favor chronic ischemic microvascular white matter disease. Right hip x-ray shows acute intertrochanteric fracture of the right hip with varus Angulation. Bony demineralization. Twelve-lead EKG reviewed by me shows sinus rhythm.    ED Course: Patient is a 65 year old African-American male who presents to the emergency room from home for evaluation of pain in his right hip and elbow following a fall 1 day prior  to his admission.  He was recently discharged from the hospital on November 18, 2020 for a nondisplaced left femoral  shaft fracture to rehab for continued physical therapy.  Patient has been home from rehab because he does not have a pay source.  Imaging shows a right hip fracture.  Orthopedic surgery was consulted and recommended admission to medicine since patient is on Coumadin for a mechanical aortic valve.  He will be admitted to the hospital for further evaluation.  Hospital course from Dr. Jimmye Norman 3/9-3/11/22: Pt was found to have right hip fracture after a mechanical fall at home. Pt is s/p intramedullary fixation right intertrochanteric hip fracture on 12/03/20. Of note, pt was given 2 units of pRBCs while inpatient for acute postoperative blood loss anemia. Also, pt has hx aortic valve repair in 2012 and is on coumadin chronically. Pt will continue w/ lovenox bridge and warfarin until INR is between 2.5-3.5. Lovenox may be discontinued once INR is between 2.5-3.5 but coumadin will continue. PT/OT evaluated the pt and recommended SNF. For more information, please see previous progress/consult notes.   Discharge Diagnoses:  Principal Problem:   Closed right hip fracture ([place]) Active Problems:   Hx of aortic valve replacement   Essential hypertension   Protein-calorie malnutrition, severe   Fall   Chronic back pain   Acute intertrochanteric fracture of right hip: s/p mechanical fall. S/p intramedullary fixation of right intertrochanteric hip fracture on 12/03/2020.PT recs SNF.   Acute postoperative blood loss anemia: w/ IDA. S/p 2 units of pRBCs transfused. Continue on iron supplements   Hypokalemia: WNL.   Hypomagnesemia: Mg repleted.   Hx of aortic valve repair: in 2012. Continue on warfarin & lovenox for bridging. INR is still subtherapeutic today & needs to be between 2.5-3.5.    HTN: continue on metoprolol, losartan, maxzide    Constipation: continue on senna   Atrial flutter: unknown typical vs atypical. Continue on coumadin, metoprolol   Discharge Instructions  Discharge  Instructions    Diet - low sodium heart healthy   Complete by: As directed    Discharge instructions   Complete by: As directed    F/u w/ ortho surg, Dr. Mack Guise in 10 days. F/u w/ PCP in 1-2 weeks   Increase activity slowly   Complete by: As directed    No wound care   Complete by: As directed      Allergies as of 12/10/2020      Reactions   Anectine [succinylcholine Chloride] Anaphylaxis   Atenolol Other (See Comments)   Dry mouth   Enalapril Other (See Comments)   cough      Medication List    TAKE these medications   acetaminophen 325 MG tablet Commonly known as: TYLENOL Take 650 mg by mouth every 6 (six) hours as needed.   albuterol 108 (90 Base) MCG/ACT inhaler Commonly known as: VENTOLIN HFA Inhale 2 puffs into the lungs every 6 (six) hours as needed.   enoxaparin 80 MG/0.8ML injection Commonly known as: LOVENOX Inject 0.7 mLs (70 mg total) into the skin every 12 (twelve) hours for 7 days. Continue lovenox until INR is between 2.5-3.5   ferrous sulfate 325 (65 FE) MG tablet Take 1 tablet (325 mg total) by mouth every other day. Start taking on: December 12, 9369   folic acid 1 MG tablet Commonly known as: FOLVITE Take 1 mg by mouth daily.   gabapentin 300 MG capsule Commonly known as: NEURONTIN Take 300 mg by mouth 3 (three) times  daily.   hydrOXYzine 25 MG tablet Commonly known as: ATARAX/VISTARIL Take 25 mg by mouth 3 (three) times daily as needed for itching.   losartan 100 MG tablet Commonly known as: COZAAR Take 100 mg by mouth daily.   metoCLOPramide 5 MG tablet Commonly known as: REGLAN Take 1 tablet (5 mg total) by mouth every 8 (eight) hours as needed for nausea or vomiting.   metoprolol succinate 100 MG 24 hr tablet Commonly known as: TOPROL-XL Take 100 mg by mouth daily.   multivitamin with minerals Tabs tablet Take 1 tablet by mouth daily.   oxyCODONE 5 MG immediate release tablet Commonly known as: Oxy IR/ROXICODONE Take 1 tablet  (5 mg total) by mouth every 6 (six) hours as needed for up to 2 days for moderate pain or severe pain.   thiamine 100 MG tablet Take 1 tablet (100 mg total) by mouth daily.   traZODone 50 MG tablet Commonly known as: DESYREL Take 1 tablet (50 mg total) by mouth at bedtime as needed for sleep.   triamterene-hydrochlorothiazide 37.5-25 MG tablet Commonly known as: MAXZIDE-25 Take 1 tablet by mouth daily.   Vitamin D3 25 MCG tablet Commonly known as: Vitamin D Take 1 tablet (1,000 Units total) by mouth daily.   warfarin 5 MG tablet Commonly known as: COUMADIN Take 1 tablet (5 mg total) by mouth daily at 4 PM.       Contact information for after-discharge care    [facility] .   Service: Skilled Nursing Contact information: [address] [place] [phone]                 Allergies  Allergen Reactions  . Anectine [Succinylcholine Chloride] Anaphylaxis  . Atenolol Other (See Comments)    Dry mouth  . Enalapril Other (See Comments)    cough    Consultations:  Ortho surg    Procedures/Studies: DG Chest 1 View  Result Date: 12/02/2020 CLINICAL DATA:  Fall, right hip injury. EXAM: CHEST  1 VIEW COMPARISON:  09/30/2020 and 10/07/2020 FINDINGS: Stable thoracolumbar fixators. Bony demineralization. Old right clavicular deformity. Old healed right rib fractures. Low lung volumes are present, causing crowding of the pulmonary vasculature. Atherosclerotic calcification of the aortic arch. Heart size within normal limits. Mildly elevated right hemidiaphragm. The lungs appear clear. IMPRESSION: 1. Low lung volumes. 2. Atherosclerotic calcification of the aortic arch. 3. Bony demineralization. Electronically Signed   By: Van Clines M.D.   On: 12/02/2020 09:44   DG Pelvis 1-2 Views  Result Date: 11/14/2020 CLINICAL DATA:  Recent fall. EXAM: LEFT FEMUR 2 VIEWS; PELVIS - 1-2 VIEW COMPARISON:  October 01, 2020 FINDINGS: The patient has undergone recent intramedullary nail placement through the proximal left femur. The hardware appears intact. There is evidence for a healing intratrochanteric fracture of the proximal left femur. Degenerative changes are noted of both hips. There is no evidence for hardware fracture or failure. There is no new acute displaced fracture. Vascular calcifications are noted. There is soft tissue swelling of the left lower extremity. IMPRESSION: 1. Status post recent intramedullary nail placement through the proximal left femur. 2. Healing intratrochanteric fracture of the proximal left femur. 3. No new acute displaced fracture identified. Electronically Signed   By: Constance Holster M.D.   On: 11/14/2020 18:04   CT Head Wo Contrast  Result Date: 12/02/2020 CLINICAL DATA:  Fall, inter trochanteric right hip fracture. Anti coagulation. EXAM: CT HEAD WITHOUT CONTRAST TECHNIQUE:  Contiguous axial images were obtained from the base of the skull through the vertex without intravenous contrast. COMPARISON:  11/14/2020 FINDINGS: Brain: Underlying the left frontotemporal craniotomy site, there is chronically stable accentuated density for example on image 17 of series 2 unchanged from the earliest available comparison of 09/30/2020, favoring either dural thickening or chronic subdural hematoma. This is not appreciably changed compared to 09/30/2020 and measures about 2-3 mm in thickness. Periventricular white matter and corona radiata hypodensities favor chronic ischemic microvascular white matter disease. Otherwise, the brainstem, cerebellum, cerebral peduncles, thalamus, basal ganglia, basilar cisterns, and ventricular system appear within normal limits. No mass lesion or acute CVA identified. Vascular: There is atherosclerotic calcification of the cavernous carotid arteries bilaterally. Skull: Left frontotemporal craniotomy.  No acute calvarial findings. Sinuses/Orbits: Unremarkable Other: No  supplemental non-categorized findings. IMPRESSION: 1. No acute intracranial findings. 2. Stable accentuated density underlying the left frontotemporal craniotomy site measuring 2-3 mm in thickness, favoring either dural thickening or chronic subdural hematoma. This is not appreciably changed compared to 09/30/2020. 3. Periventricular white matter and corona radiata hypodensities favor chronic ischemic microvascular white matter disease. Electronically Signed   By: Van Clines M.D.   On: 12/02/2020 09:49   CT Head Wo Contrast  Result Date: 11/14/2020 CLINICAL DATA:  Fall EXAM: CT HEAD WITHOUT CONTRAST CT CERVICAL SPINE WITHOUT CONTRAST TECHNIQUE: Multidetector CT imaging of the head and cervical spine was performed following the standard protocol without intravenous contrast. Multiplanar CT image reconstructions of the cervical spine were also generated. COMPARISON:  09/30/2020 FINDINGS: CT HEAD FINDINGS Brain: No evidence of acute infarction, hemorrhage, hydrocephalus, extra-axial collection or mass lesion/mass effect. Extensive periventricular and deep white matter hypodensity with moderate global cerebral volume loss. Vascular: No hyperdense vessel or unexpected calcification. Skull: Prior left pterional craniotomy. Negative for fracture or focal lesion. Sinuses/Orbits: No acute finding. Other: None. CT CERVICAL SPINE FINDINGS Alignment: Normal. Skull base and vertebrae: No acute fracture. No primary bone lesion or focal pathologic process. Soft tissues and spinal canal: No prevertebral fluid or swelling. No visible canal hematoma. Disc levels:  Intact. Upper chest: Negative. Other: None. IMPRESSION: 1. No acute intracranial pathology. 2. Extensive small-vessel white matter disease and moderate global cerebral volume loss. 3. Prior left pterional craniotomy. 4. No fracture or static subluxation of the cervical spine. Electronically Signed   By: Eddie Candle M.D.   On: 11/14/2020 17:59   CT Cervical  Spine Wo Contrast  Result Date: 11/14/2020 CLINICAL DATA:  Fall EXAM: CT HEAD WITHOUT CONTRAST CT CERVICAL SPINE WITHOUT CONTRAST TECHNIQUE: Multidetector CT imaging of the head and cervical spine was performed following the standard protocol without intravenous contrast. Multiplanar CT image reconstructions of the cervical spine were also generated. COMPARISON:  09/30/2020 FINDINGS: CT HEAD FINDINGS Brain: No evidence of acute infarction, hemorrhage, hydrocephalus, extra-axial collection or mass lesion/mass effect. Extensive periventricular and deep white matter hypodensity with moderate global cerebral volume loss. Vascular: No hyperdense vessel or unexpected calcification. Skull: Prior left pterional craniotomy. Negative for fracture or focal lesion. Sinuses/Orbits: No acute finding. Other: None. CT CERVICAL SPINE FINDINGS Alignment: Normal. Skull base and vertebrae: No acute fracture. No primary bone lesion or focal pathologic process. Soft tissues and spinal canal: No prevertebral fluid or swelling. No visible canal hematoma. Disc levels:  Intact. Upper chest: Negative. Other: None. IMPRESSION: 1. No acute intracranial pathology. 2. Extensive small-vessel white matter disease and moderate global cerebral volume loss. 3. Prior left pterional craniotomy. 4. No fracture or static subluxation of the cervical spine.  Electronically Signed   By: Eddie Candle M.D.   On: 11/14/2020 17:59   CT Hip Left Wo Contrast  Result Date: 11/14/2020 CLINICAL DATA:  Fall, left hip pain. EXAM: CT OF THE LEFT HIP WITHOUT CONTRAST TECHNIQUE: Multidetector CT imaging of the left hip was performed according to the standard protocol. Multiplanar CT image reconstructions were also generated. COMPARISON:  Radiographs from 11/14/2020 FINDINGS: Bones/Joint/Cartilage Left hip IM nail with cephalomedullary device (integrated interlocking InterTAN nail) in place. Inter trochanteric fracture with displaced lesser trochanteric fragment  observed. On images 40 through 32 of series 7, there is a nondisplaced fracture extending from the inferior margin of the inter trochanteric fracture down into the proximal diaphysis laterally up to about 4.7 cm below the lowest (medial) margin of the intertrochanteric fracture. The anterior cortical component is poorly seen but the distal most extension appears to be in the lateral cortex. Today's hip CT includes the proximal 17 cm of the IM nail but does not include the entire upper leg. Displaced lesser trochanteric fragment as before. Scattered small fragments of the greater trochanter noted. There is some osteoid deposition along the lateral margins of the lag and compression screws. Ligaments Suboptimally assessed by CT. Muscles and Tendons The iliopsoas attaches to displaced lesser trochanter fragment. Distal iliopsoas is indistinct. Soft tissues Mild subcutaneous edema lateral to the hip.  Atherosclerosis. IMPRESSION: 1. Left hip IM nail with cephalomedullary device in place. There is a nondisplaced fracture extending from the inferior margin of the inter trochanteric fracture obliquely down into the proximal diaphysis laterally up to about 4.7 cm below the lowest (medial) margin of the intertrochanteric fracture; I not observe that this oblique nondisplaced fracture was present on the 10/25/2020 radiographs. 2. Displaced lesser trochanteric fragment as before. 3. Atherosclerosis. Electronically Signed   By: Van Clines M.D.   On: 11/14/2020 19:58   DG HIP OPERATIVE UNILAT W OR W/O PELVIS RIGHT  Result Date: 12/03/2020 CLINICAL DATA:  ORIF right femur EXAM: RIGHT FEMUR 2 VIEWS; OPERATIVE RIGHT HIP WITH PELVIS COMPARISON:  None. FINDINGS: Multiple C-arm images show placement of a gamma nail on the right with distal locking screws. Good reduction of the inter trochanteric fracture, with anatomic alignment. IMPRESSION: ORIF right intertrochanteric fracture with gamma nail placement. Good reduction. No  complicating feature. Good alignment. No unexpected finding. Electronically Signed   By: Nelson Chimes M.D.   On: 12/03/2020 16:11   DG Hip Unilat W or Wo Pelvis 2-3 Views Right  Result Date: 12/02/2020 CLINICAL DATA:  Fall, right hip pain EXAM: DG HIP (WITH OR WITHOUT PELVIS) 2-3V RIGHT COMPARISON:  11/14/2020 FINDINGS: Acute intertrochanteric fracture of the right hip with varus angulation. Bony demineralization.  Left hip ORIF. IMPRESSION: 1. Acute intertrochanteric fracture of the right hip with varus angulation. 2. Bony demineralization. Electronically Signed   By: Van Clines M.D.   On: 12/02/2020 09:39   DG FEMUR MIN 2 VIEWS LEFT  Result Date: 11/14/2020 CLINICAL DATA:  Recent fall. EXAM: LEFT FEMUR 2 VIEWS; PELVIS - 1-2 VIEW COMPARISON:  October 01, 2020 FINDINGS: The patient has undergone recent intramedullary nail placement through the proximal left femur. The hardware appears intact. There is evidence for a healing intratrochanteric fracture of the proximal left femur. Degenerative changes are noted of both hips. There is no evidence for hardware fracture or failure. There is no new acute displaced fracture. Vascular calcifications are noted. There is soft tissue swelling of the left lower extremity. IMPRESSION: 1. Status post recent intramedullary nail  placement through the proximal left femur. 2. Healing intratrochanteric fracture of the proximal left femur. 3. No new acute displaced fracture identified. Electronically Signed   By: Constance Holster M.D.   On: 11/14/2020 18:04   DG FEMUR, MIN 2 VIEWS RIGHT  Result Date: 12/03/2020 CLINICAL DATA:  ORIF right femur EXAM: RIGHT FEMUR 2 VIEWS; OPERATIVE RIGHT HIP WITH PELVIS COMPARISON:  None. FINDINGS: Multiple C-arm images show placement of a gamma nail on the right with distal locking screws. Good reduction of the inter trochanteric fracture, with anatomic alignment. IMPRESSION: ORIF right intertrochanteric fracture with gamma nail  placement. Good reduction. No complicating feature. Good alignment. No unexpected finding. Electronically Signed   By: Nelson Chimes M.D.   On: 12/03/2020 16:11    Subjective: Pt c/o fatigue    Discharge Exam: Vitals:   12/10/20 0808 12/10/20 1113  BP: (!) 184/92 (!) 150/90  Pulse: 80 91  Resp: 16 18  Temp: 98.7 F (37.1 C) 97.8 F (36.6 C)  SpO2: 99% 97%   Vitals:   12/10/20 0020 12/10/20 0416 12/10/20 0808 12/10/20 1113  BP: (!) 161/90 (!) 153/81 (!) 184/92 (!) 150/90  Pulse: 80 78 80 91  Resp: 18 18 16 18   Temp: 98.7 F (37.1 C) 98 F (36.7 C) 98.7 F (37.1 C) 97.8 F (36.6 C)  TempSrc: Oral Oral    SpO2: 99% 98% 99% 97%  Weight:      Height:        General: Pt is alert, awake, not in acute distress Cardiovascular:  S1/S2 +, no rubs, no gallops Respiratory: CTA bilaterally, no wheezing, no rhonchi Abdominal: Soft, NT, ND, bowel sounds + Extremities:  no cyanosis    The results of significant diagnostics from this hospitalization (including imaging, microbiology, ancillary and laboratory) are listed below for reference.     Microbiology: Recent Results (from the past 240 hour(s))  Resp Panel by RT-PCR (Flu A&B, Covid) Nasopharyngeal Swab     Status: None   Collection Time: 12/02/20  8:13 AM   Specimen: Nasopharyngeal Swab; Nasopharyngeal(NP) swabs in vial transport medium  Result Value Ref Range Status   SARS Coronavirus 2 by RT PCR NEGATIVE NEGATIVE Final    Comment: (NOTE) SARS-CoV-2 target nucleic acids are NOT DETECTED.  The SARS-CoV-2 RNA is generally detectable in upper respiratory specimens during the acute phase of infection. The lowest concentration of SARS-CoV-2 viral copies this assay can detect is 138 copies/mL. A negative result does not preclude SARS-Cov-2 infection and should not be used as the sole basis for treatment or other patient management decisions. A negative result may occur with  improper specimen collection/handling, submission  of specimen other than nasopharyngeal swab, presence of viral mutation(s) within the areas targeted by this assay, and inadequate number of viral copies(<138 copies/mL). A negative result must be combined with clinical observations, patient history, and epidemiological information. The expected result is Negative.  Fact Sheet for Patients:  EntrepreneurPulse.com.au  Fact Sheet for Healthcare Providers:  IncredibleEmployment.be  This test is no t yet approved or cleared by the [place] FDA and  has been authorized for detection and/or diagnosis of SARS-CoV-2 by FDA under an Emergency Use Authorization (EUA). This EUA will remain  in effect (meaning this test can be used) for the duration of the COVID-19 declaration under Section 564(b)(1) of the Act, 21 U.S.C.section 360bbb-3(b)(1), unless the authorization is terminated  or revoked sooner.       Influenza A by PCR NEGATIVE NEGATIVE Final  Influenza B by PCR NEGATIVE NEGATIVE Final    Comment: (NOTE) The Xpert Xpress SARS-CoV-2/FLU/RSV plus assay is intended as an aid in the diagnosis of influenza from Nasopharyngeal swab specimens and should not be used as a sole basis for treatment. Nasal washings and aspirates are unacceptable for Xpert Xpress SARS-CoV-2/FLU/RSV testing.  Fact Sheet for Patients: EntrepreneurPulse.com.au  Fact Sheet for Healthcare Providers: IncredibleEmployment.be  This test is not yet approved or cleared by the [place] FDA and has been authorized for detection and/or diagnosis of SARS-CoV-2 by FDA under an Emergency Use Authorization (EUA). This EUA will remain in effect (meaning this test can be used) for the duration of the COVID-19 declaration under Section 564(b)(1) of the Act, 21 U.S.C. section 360bbb-3(b)(1), unless the authorization is terminated or revoked.  Performed at [facility], [place], [place] [address]   Resp Panel by RT-PCR (Flu A&B, Covid) Nasopharyngeal Swab     Status: None   Collection Time: 12/10/20  8:23 AM   Specimen: Nasopharyngeal Swab; Nasopharyngeal(NP) swabs in vial transport medium  Result Value Ref Range Status   SARS Coronavirus 2 by RT PCR NEGATIVE NEGATIVE Final    Comment: (NOTE) SARS-CoV-2 target nucleic acids are NOT DETECTED.  The SARS-CoV-2 RNA is generally detectable in upper respiratory specimens during the acute phase of infection. The lowest concentration of SARS-CoV-2 viral copies this assay can detect is 138 copies/mL. A negative result does not preclude SARS-Cov-2 infection and should not be used as the sole basis for treatment or other patient management decisions. A negative result may occur with  improper specimen collection/handling, submission of specimen other than nasopharyngeal swab, presence of viral mutation(s) within the areas targeted by this assay, and inadequate number of viral copies(<138 copies/mL). A negative result must be combined with clinical observations, patient history, and epidemiological information. The expected result is Negative.  Fact Sheet for Patients:  EntrepreneurPulse.com.au  Fact Sheet for Healthcare Providers:  IncredibleEmployment.be  This test is no t yet approved or cleared by the [place] FDA and  has been authorized for detection and/or diagnosis of SARS-CoV-2 by FDA under an Emergency Use Authorization (EUA). This EUA will remain  in effect (meaning this test can be used) for the duration of the COVID-19 declaration under Section 564(b)(1) of the Act, 21 U.S.C.section 360bbb-3(b)(1), unless the authorization is terminated  or revoked sooner.       Influenza A by PCR NEGATIVE NEGATIVE Final   Influenza B by PCR NEGATIVE NEGATIVE Final    Comment: (NOTE) The Xpert Xpress SARS-CoV-2/FLU/RSV plus assay is intended as an aid in the  diagnosis of influenza from Nasopharyngeal swab specimens and should not be used as a sole basis for treatment. Nasal washings and aspirates are unacceptable for Xpert Xpress SARS-CoV-2/FLU/RSV testing.  Fact Sheet for Patients: EntrepreneurPulse.com.au  Fact Sheet for Healthcare Providers: IncredibleEmployment.be  This test is not yet approved or cleared by the [place] FDA and has been authorized for detection and/or diagnosis of SARS-CoV-2 by FDA under an Emergency Use Authorization (EUA). This EUA will remain in effect (meaning this test can be used) for the duration of the COVID-19 declaration under Section 564(b)(1) of the Act, 21 U.S.C. section 360bbb-3(b)(1), unless the authorization is terminated or revoked.  Performed at [facility], [address] [place], [place] [address]      Labs: BNP (last 3 results) Recent Labs    09/30/20 1600  BNP 518.8*   Basic Metabolic Panel: Recent  Labs  Lab 12/06/20 0514 12/07/20 0614 12/08/20 0603 12/09/20 0649 12/10/20 0652  NA 137 136 137 138 136  K 3.4* 3.7 3.4* 3.4* 4.1  CL 101 102 103 104 101  CO2 29 27 26 28 30   GLUCOSE 126* 116* 106* 107* 125*  BUN 26* 20 17 12 10   CREATININE 0.77 0.58* 0.59* 0.47* 0.49*  CALCIUM 8.9 8.9 8.8* 8.8* 9.0  MG  --  1.4* 1.6* 1.6* 1.6*  PHOS  --  3.1  --   --   --    Liver Function Tests: No results for input(s): AST, ALT, ALKPHOS, BILITOT, PROT, ALBUMIN in the last 168 hours. No results for input(s): LIPASE, AMYLASE in the last 168 hours. No results for input(s): AMMONIA in the last 168 hours. CBC: Recent Labs  Lab 12/04/20 0454 12/05/20 0550 12/06/20 0514 12/07/20 0614 12/08/20 0603 12/09/20 0649 12/10/20 0652  WBC 16.3* 12.6* 11.2* 9.5 9.8 9.9 13.3*  NEUTROABS 13.6* 9.4*  --   --   --   --   --   HGB 8.8* 8.1* 7.6* 7.7* 9.0* 8.6* 8.8*  HCT 28.2* 25.2* 23.4* 23.9* 28.3* 26.9* 27.2*  MCV 81.0 79.5* 79.9* 80.5 84.2 82.3  82.2  PLT 150 154 170 176 194 212 260   Cardiac Enzymes: No results for input(s): CKTOTAL, CKMB, CKMBINDEX, TROPONINI in the last 168 hours. BNP: Invalid input(s): POCBNP CBG: No results for input(s): GLUCAP in the last 168 hours. D-Dimer No results for input(s): DDIMER in the last 72 hours. Hgb A1c No results for input(s): HGBA1C in the last 72 hours. Lipid Profile No results for input(s): CHOL, HDL, LDLCALC, TRIG, CHOLHDL, LDLDIRECT in the last 72 hours. Thyroid function studies No results for input(s): TSH, T4TOTAL, T3FREE, THYROIDAB in the last 72 hours.  Invalid input(s): FREET3 Anemia work up No results for input(s): VITAMINB12, FOLATE, FERRITIN, TIBC, IRON, RETICCTPCT in the last 72 hours. Urinalysis    Component Value Date/Time   COLORURINE YELLOW (A) 09/30/2020 1405   APPEARANCEUR HAZY (A) 09/30/2020 1405   LABSPEC 1.039 (H) 09/30/2020 1405   PHURINE 5.0 09/30/2020 1405   GLUCOSEU NEGATIVE 09/30/2020 1405   HGBUR MODERATE (A) 09/30/2020 1405   BILIRUBINUR NEGATIVE 09/30/2020 1405   KETONESUR NEGATIVE 09/30/2020 1405   PROTEINUR NEGATIVE 09/30/2020 1405   NITRITE NEGATIVE 09/30/2020 1405   LEUKOCYTESUR NEGATIVE 09/30/2020 1405   Sepsis Labs Invalid input(s): PROCALCITONIN,  WBC,  LACTICIDVEN Microbiology Recent Results (from the past 240 hour(s))  Resp Panel by RT-PCR (Flu A&B, Covid) Nasopharyngeal Swab     Status: None   Collection Time: 12/02/20  8:13 AM   Specimen: Nasopharyngeal Swab; Nasopharyngeal(NP) swabs in vial transport medium  Result Value Ref Range Status   SARS Coronavirus 2 by RT PCR NEGATIVE NEGATIVE Final    Comment: (NOTE) SARS-CoV-2 target nucleic acids are NOT DETECTED.  The SARS-CoV-2 RNA is generally detectable in upper respiratory specimens during the acute phase of infection. The lowest concentration of SARS-CoV-2 viral copies this assay can detect is 138 copies/mL. A negative result does not preclude SARS-Cov-2 infection and should  not be used as the sole basis for treatment or other patient management decisions. A negative result may occur with  improper specimen collection/handling, submission of specimen other than nasopharyngeal swab, presence of viral mutation(s) within the areas targeted by this assay, and inadequate number of viral copies(<138 copies/mL). A negative result must be combined with clinical observations, patient history, and epidemiological information. The expected result is Negative.  Fact  Sheet for Patients:  EntrepreneurPulse.com.au  Fact Sheet for Healthcare Providers:  IncredibleEmployment.be  This test is no t yet approved or cleared by the [place] FDA and  has been authorized for detection and/or diagnosis of SARS-CoV-2 by FDA under an Emergency Use Authorization (EUA). This EUA will remain  in effect (meaning this test can be used) for the duration of the COVID-19 declaration under Section 564(b)(1) of the Act, 21 U.S.C.section 360bbb-3(b)(1), unless the authorization is terminated  or revoked sooner.       Influenza A by PCR NEGATIVE NEGATIVE Final   Influenza B by PCR NEGATIVE NEGATIVE Final    Comment: (NOTE) The Xpert Xpress SARS-CoV-2/FLU/RSV plus assay is intended as an aid in the diagnosis of influenza from Nasopharyngeal swab specimens and should not be used as a sole basis for treatment. Nasal washings and aspirates are unacceptable for Xpert Xpress SARS-CoV-2/FLU/RSV testing.  Fact Sheet for Patients: EntrepreneurPulse.com.au  Fact Sheet for Healthcare Providers: IncredibleEmployment.be  This test is not yet approved or cleared by the [place] FDA and has been authorized for detection and/or diagnosis of SARS-CoV-2 by FDA under an Emergency Use Authorization (EUA). This EUA will remain in effect (meaning this test can be used) for the duration of the COVID-19 declaration under  Section 564(b)(1) of the Act, 21 U.S.C. section 360bbb-3(b)(1), unless the authorization is terminated or revoked.  Performed at [facility], [place], [place] [address]   Resp Panel by RT-PCR (Flu A&B, Covid) Nasopharyngeal Swab     Status: None   Collection Time: 12/10/20  8:23 AM   Specimen: Nasopharyngeal Swab; Nasopharyngeal(NP) swabs in vial transport medium  Result Value Ref Range Status   SARS Coronavirus 2 by RT PCR NEGATIVE NEGATIVE Final    Comment: (NOTE) SARS-CoV-2 target nucleic acids are NOT DETECTED.  The SARS-CoV-2 RNA is generally detectable in upper respiratory specimens during the acute phase of infection. The lowest concentration of SARS-CoV-2 viral copies this assay can detect is 138 copies/mL. A negative result does not preclude SARS-Cov-2 infection and should not be used as the sole basis for treatment or other patient management decisions. A negative result may occur with  improper specimen collection/handling, submission of specimen other than nasopharyngeal swab, presence of viral mutation(s) within the areas targeted by this assay, and inadequate number of viral copies(<138 copies/mL). A negative result must be combined with clinical observations, patient history, and epidemiological information. The expected result is Negative.  Fact Sheet for Patients:  EntrepreneurPulse.com.au  Fact Sheet for Healthcare Providers:  IncredibleEmployment.be  This test is no t yet approved or cleared by the [place] FDA and  has been authorized for detection and/or diagnosis of SARS-CoV-2 by FDA under an Emergency Use Authorization (EUA). This EUA will remain  in effect (meaning this test can be used) for the duration of the COVID-19 declaration under Section 564(b)(1) of the Act, 21 U.S.C.section 360bbb-3(b)(1), unless the authorization is terminated  or revoked sooner.       Influenza A by PCR  NEGATIVE NEGATIVE Final   Influenza B by PCR NEGATIVE NEGATIVE Final    Comment: (NOTE) The Xpert Xpress SARS-CoV-2/FLU/RSV plus assay is intended as an aid in the diagnosis of influenza from Nasopharyngeal swab specimens and should not be used as a sole basis for treatment. Nasal washings and aspirates are unacceptable for Xpert Xpress SARS-CoV-2/FLU/RSV testing.  Fact Sheet for Patients: EntrepreneurPulse.com.au  Fact Sheet for Healthcare Providers: IncredibleEmployment.be  This test is not yet approved  or cleared by the [place] and has been authorized for detection and/or diagnosis of SARS-CoV-2 by FDA under an Emergency Use Authorization (EUA). This EUA will remain in effect (meaning this test can be used) for the duration of the COVID-19 declaration under Section 564(b)(1) of the Act, 21 U.S.C. section 360bbb-3(b)(1), unless the authorization is terminated or revoked.  Performed at [facility], [address] [place], [place] [address]      Time coordinating discharge: Over 30 minutes  SIGNED:   Wyvonnia Dusky, MD  [facility] 12/10/2020, 12:38 PM Pager   If 7PM-7AM, please contact night-coverage

## 2021-05-06 ENCOUNTER — Other Ambulatory Visit: Payer: Self-pay

## 2021-05-06 ENCOUNTER — Encounter: Payer: Self-pay | Admitting: Emergency Medicine

## 2021-05-06 ENCOUNTER — Emergency Department: Payer: Medicare Other

## 2021-05-06 DIAGNOSIS — S0990XA Unspecified injury of head, initial encounter: Secondary | ICD-10-CM | POA: Diagnosis present

## 2021-05-06 DIAGNOSIS — Z79899 Other long term (current) drug therapy: Secondary | ICD-10-CM | POA: Insufficient documentation

## 2021-05-06 DIAGNOSIS — S0101XA Laceration without foreign body of scalp, initial encounter: Secondary | ICD-10-CM | POA: Diagnosis not present

## 2021-05-06 DIAGNOSIS — Z7901 Long term (current) use of anticoagulants: Secondary | ICD-10-CM | POA: Insufficient documentation

## 2021-05-06 DIAGNOSIS — I10 Essential (primary) hypertension: Secondary | ICD-10-CM | POA: Insufficient documentation

## 2021-05-06 DIAGNOSIS — W01198A Fall on same level from slipping, tripping and stumbling with subsequent striking against other object, initial encounter: Secondary | ICD-10-CM | POA: Insufficient documentation

## 2021-05-06 NOTE — ED Notes (Signed)
CALL [facility] FOR ANY MEDICAL CONCERNS HER NUMBER IS [phone]. [place] PLEASE CALL [place]

## 2021-05-06 NOTE — ED Triage Notes (Signed)
Pts caregiver was helping get pt ready for the evening, he was standing with his walker and fell hitting his head on bed frame. Pt has laceration on the back of his head. He does take a blood thinner.
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
## 2021-05-07 ENCOUNTER — Emergency Department
Admission: EM | Admit: 2021-05-07 | Discharge: 2021-05-07 | Disposition: A | Discharge status: Home / Self Care | Payer: Medicare Other | Attending: Emergency Medicine | Admitting: Emergency Medicine

## 2021-05-07 DIAGNOSIS — S0101XA Laceration without foreign body of scalp, initial encounter: Secondary | ICD-10-CM

## 2021-05-07 DIAGNOSIS — W19XXXA Unspecified fall, initial encounter: Secondary | ICD-10-CM

## 2021-05-07 DIAGNOSIS — S0990XA Unspecified injury of head, initial encounter: Secondary | ICD-10-CM

## 2021-05-07 MED ORDER — LIDOCAINE-EPINEPHRINE 1 %-1:100000 IJ SOLN
20.0000 mL | Freq: Once | INTRAMUSCULAR | Status: DC
  Filled 2021-05-07: qty 20

## 2021-05-07 NOTE — ED Notes (Signed)
Upon pt arrival to room, pants soaked with urine. pericare provided and brief changed.

## 2021-05-07 NOTE — Discharge Instructions (Signed)
1.  Suture removal in 7 to 10 days. 2.  Hold Plavix x3 days, then resume. 3.  You may take Tylenol as needed for pain. 4.  Return to the ER for worsening symptoms, persistent vomiting, lethargy or other concerns.

## 2021-05-07 NOTE — ED Notes (Signed)
Head lac repaired by provider, bleeding controlled. Wound wrapped in nonadhering and coban dressings

## 2021-06-02 DEATH — deceased

## 2022-02-01 IMAGING — CT CT CERVICAL SPINE W/O CM
3 of 4 series · 12 of 35 positions shown, 14 images · non-contrast
Comparison: None.

CLINICAL DATA: Neck pain after fall from bed today.

EXAM:
CT HEAD WITHOUT CONTRAST
CT CERVICAL SPINE WITHOUT CONTRAST
TECHNIQUE: Multidetector CT imaging of the head and cervical spine was
performed following the standard protocol without intravenous
contrast. Multiplanar CT image reconstructions of the cervical spine
were also generated.

[Series 10: sagittal bone · sagittal · 0.29mm/px · 5 of 64 slices shown, 6 images]
[im 22/64  bone]
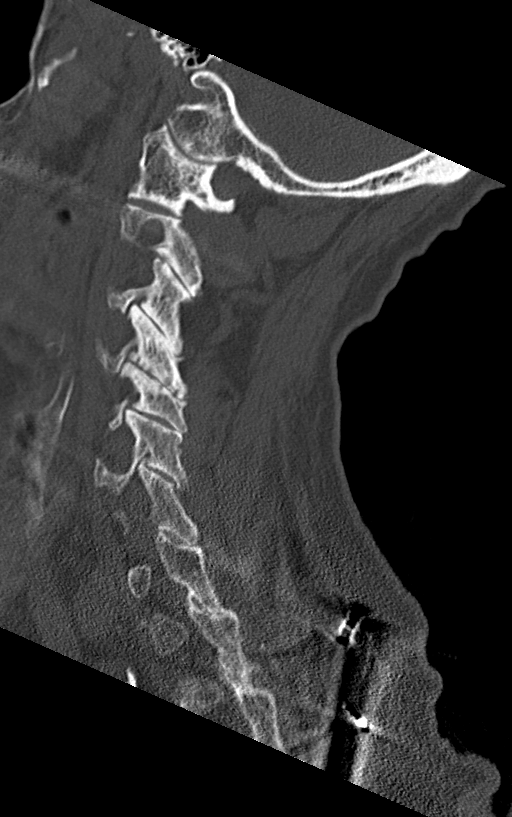
[im 27/64  bone]
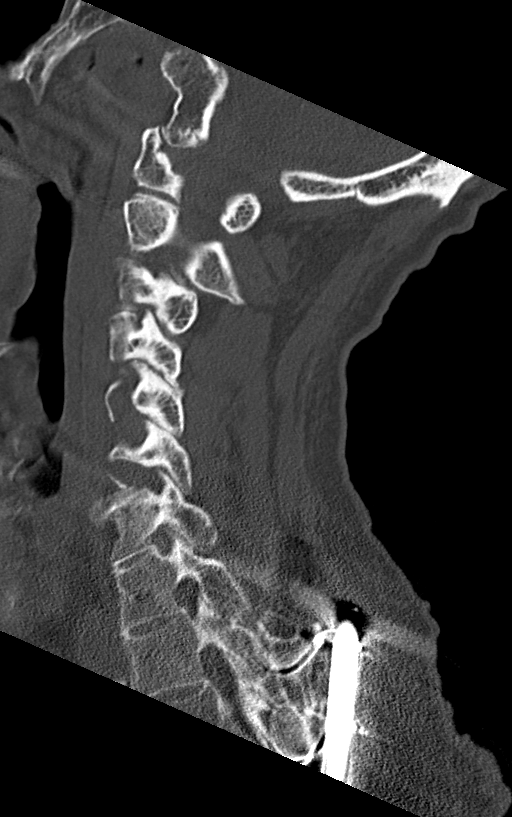
[im 32/64  soft-tissue]
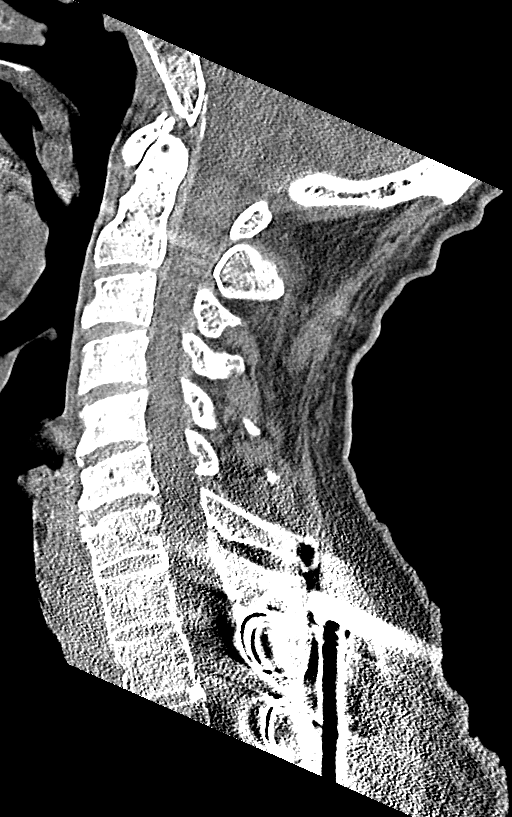
[im 32/64  bone]
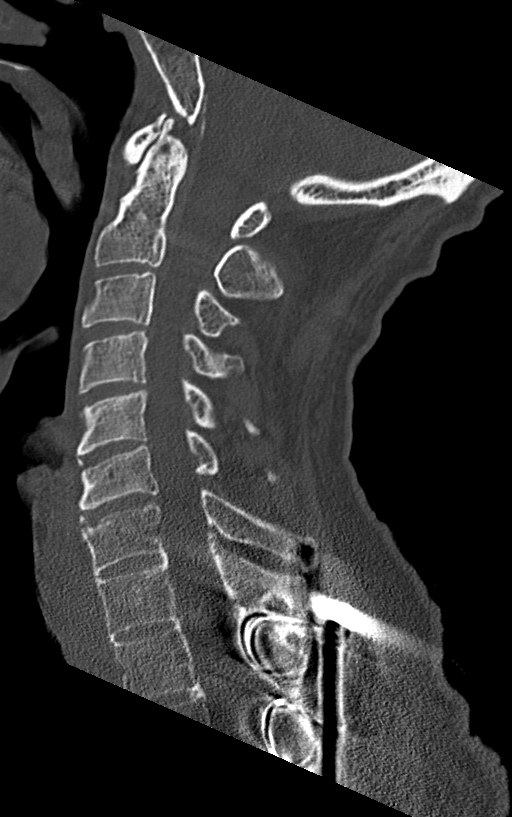
[im 37/64  bone]
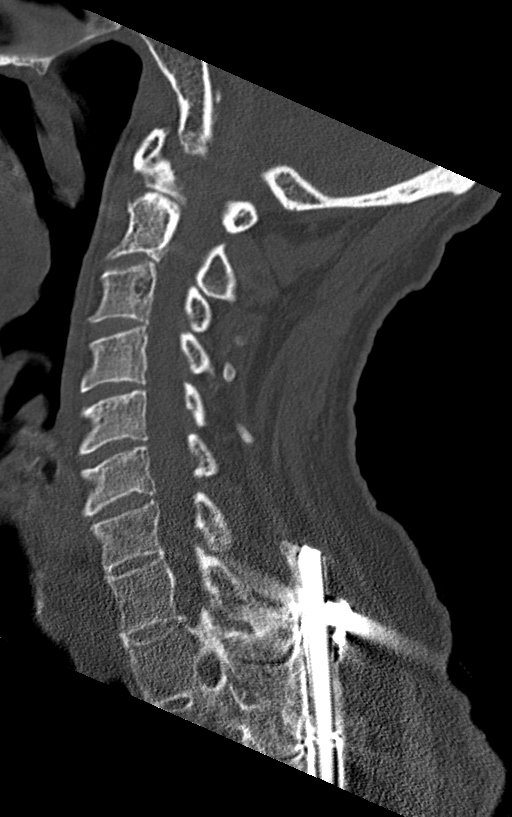
[im 43/64  bone]
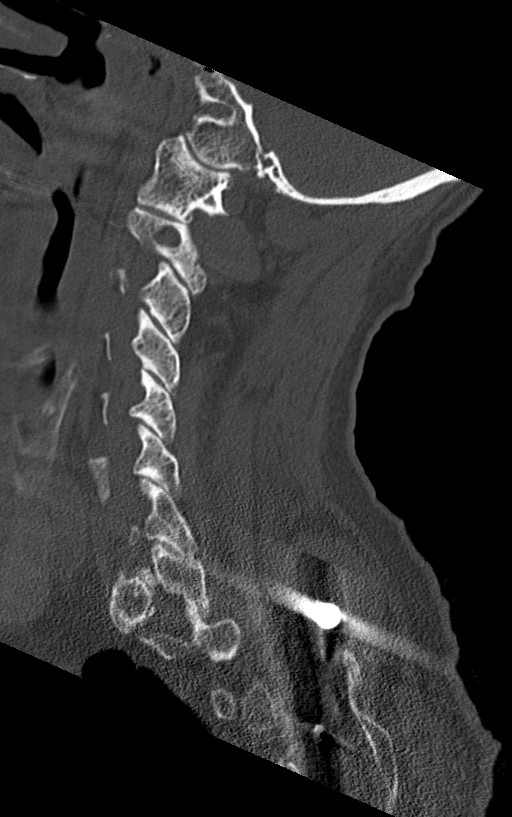

[Series 11: coronal bone · coronal · 0.28mm/px · 3 of 63 slices shown]
[im 16/63  bone]
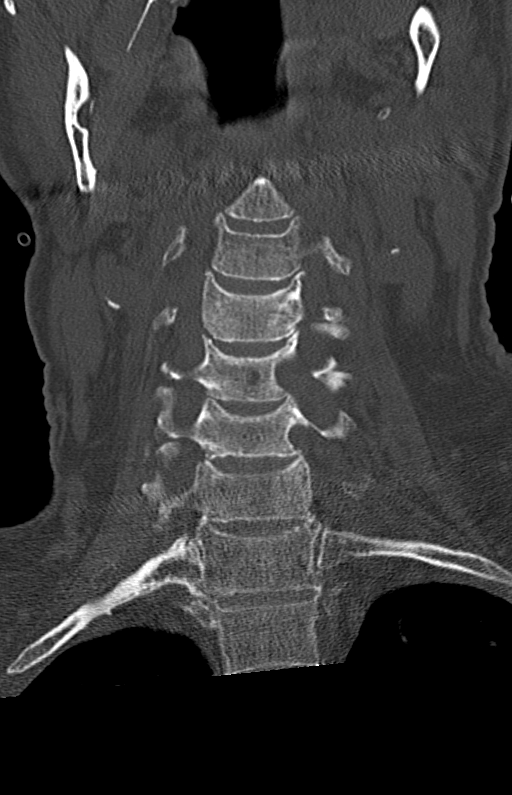
[im 26/63  bone]
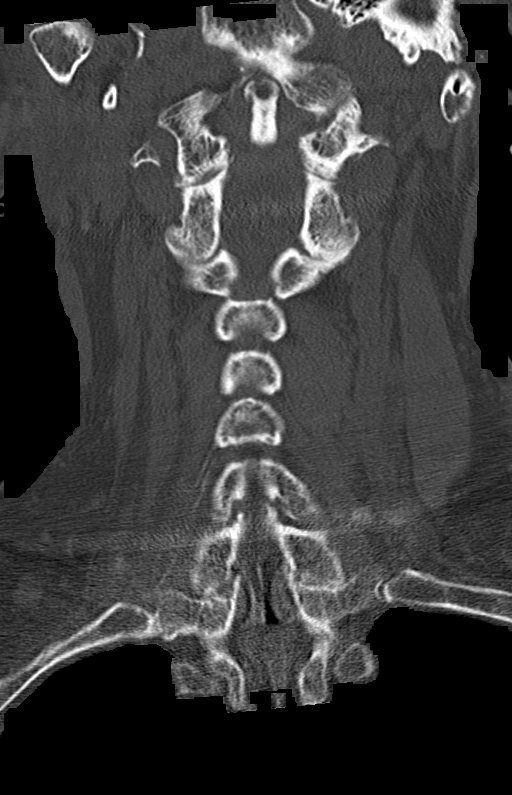
[im 37/63  bone]
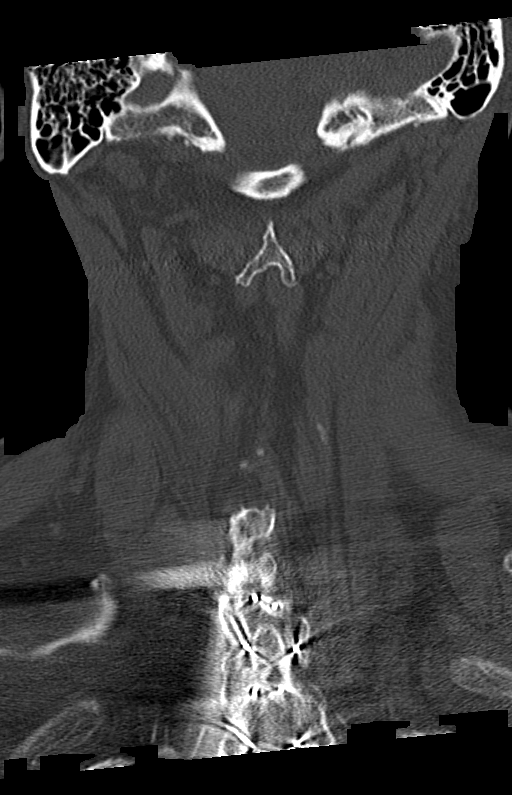

[Series 12: orthogonal bone · axial · 0.26mm/px · z∈[-301,-158]mm · 4 of 111 slices shown, 5 images]
[im 16/111  soft-tissue]
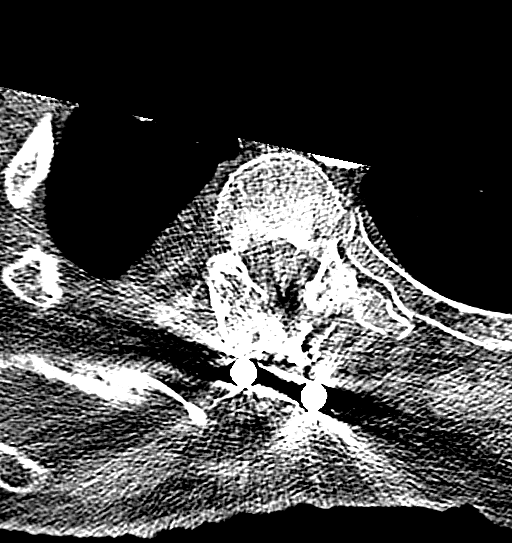
[im 16/111  bone]
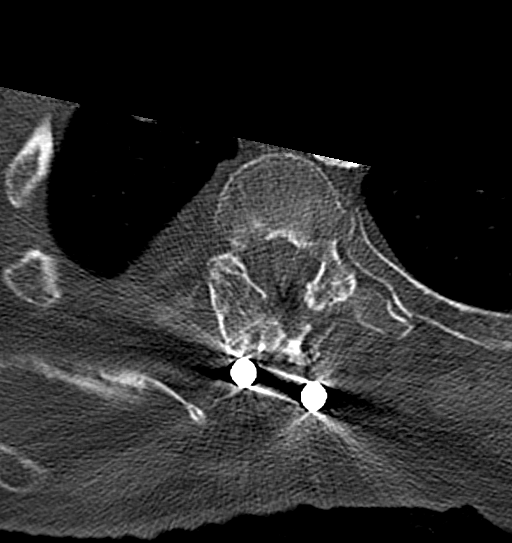
[im 48/111  bone]
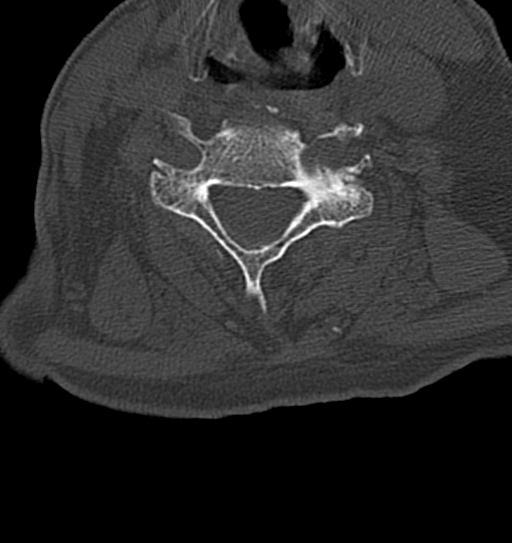
[im 63/111  bone]
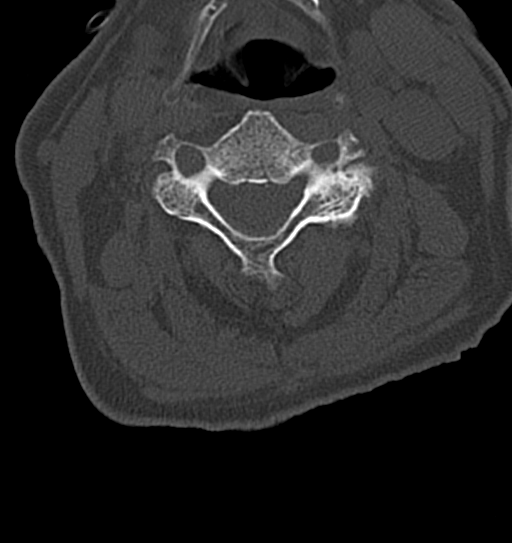
[im 95/111  bone]
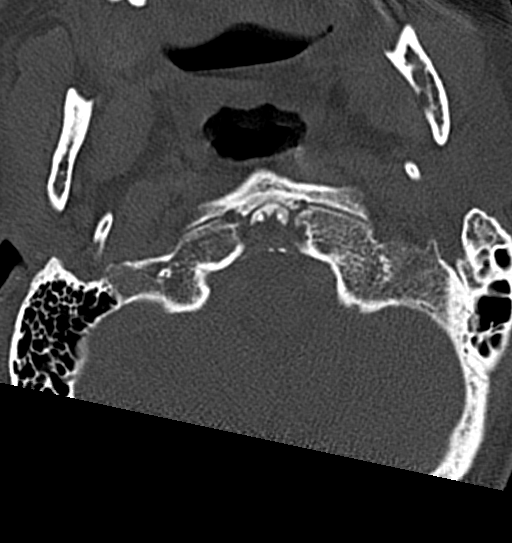

[12 of 35 positions shown; findings below may reference images not displayed]

FINDINGS: CT HEAD FINDINGS

Brain: Mild chronic ischemic white matter disease is noted. Mild
diffuse cortical atrophy is noted. No mass effect or midline shift
is noted. Ventricular size is within normal limits. There is no
evidence of mass lesion, hemorrhage or acute infarction.

Vascular: No hyperdense vessel or unexpected calcification.

Skull: Status post left frontal craniotomy. No acute osseous
abnormality is noted.

Sinuses/Orbits: No acute finding.

Other: None.

CT CERVICAL SPINE FINDINGS

Alignment: Normal.

Skull base and vertebrae: No acute fracture. No primary bone lesion
or focal pathologic process.

Soft tissues and spinal canal: No prevertebral fluid or swelling. No
visible canal hematoma.

Disc levels: Minimal degenerative changes are seen involving C5-6
and C6-7.

Upper chest: Negative.

Other: None.
IMPRESSION: 1. Mild chronic ischemic white matter disease. Mild diffuse cortical
atrophy. No acute intracranial abnormality seen.
2. Minimal degenerative changes are seen in the cervical spine. No
fracture or spondylolisthesis is noted.
